# Patient Record
Sex: Female | Born: 1950 | Race: White | Hispanic: No | Marital: Single | Smoking: Never smoker
Health system: Southern US, Community
[De-identification: ages and names within clinical notes are randomized; demographics above are authoritative.]

## PROBLEM LIST (undated history)

## (undated) DIAGNOSIS — T7840XA Allergy, unspecified, initial encounter: Secondary | ICD-10-CM

## (undated) DIAGNOSIS — J45909 Unspecified asthma, uncomplicated: Secondary | ICD-10-CM

## (undated) DIAGNOSIS — D689 Coagulation defect, unspecified: Secondary | ICD-10-CM

## (undated) DIAGNOSIS — F329 Major depressive disorder, single episode, unspecified: Secondary | ICD-10-CM

## (undated) DIAGNOSIS — E119 Type 2 diabetes mellitus without complications: Secondary | ICD-10-CM

## (undated) DIAGNOSIS — R5382 Chronic fatigue, unspecified: Secondary | ICD-10-CM

## (undated) DIAGNOSIS — K219 Gastro-esophageal reflux disease without esophagitis: Secondary | ICD-10-CM

## (undated) DIAGNOSIS — F32A Depression, unspecified: Secondary | ICD-10-CM

## (undated) DIAGNOSIS — M797 Fibromyalgia: Secondary | ICD-10-CM

## (undated) DIAGNOSIS — F419 Anxiety disorder, unspecified: Secondary | ICD-10-CM

## (undated) DIAGNOSIS — I10 Essential (primary) hypertension: Secondary | ICD-10-CM

## (undated) HISTORY — DX: Major depressive disorder, single episode, unspecified: F32.9

## (undated) HISTORY — DX: Anxiety disorder, unspecified: F41.9

## (undated) HISTORY — DX: Type 2 diabetes mellitus without complications: E11.9

## (undated) HISTORY — DX: Fibromyalgia: M79.7

## (undated) HISTORY — DX: Coagulation defect, unspecified: D68.9

## (undated) HISTORY — DX: Essential (primary) hypertension: I10

## (undated) HISTORY — DX: Chronic fatigue, unspecified: R53.82

## (undated) HISTORY — DX: Gastro-esophageal reflux disease without esophagitis: K21.9

## (undated) HISTORY — DX: Allergy, unspecified, initial encounter: T78.40XA

## (undated) HISTORY — PX: APPENDECTOMY: SHX54

## (undated) HISTORY — DX: Unspecified asthma, uncomplicated: J45.909

## (undated) HISTORY — DX: Depression, unspecified: F32.A

## (undated) HISTORY — PX: TONSILLECTOMY: SUR1361

## (undated) HISTORY — PX: TUBAL LIGATION: SHX77

---

## 1978-01-19 HISTORY — PX: AUGMENTATION MAMMAPLASTY: SUR837

## 2017-07-07 ENCOUNTER — Ambulatory Visit (INDEPENDENT_AMBULATORY_CARE_PROVIDER_SITE_OTHER): Payer: Medicare Other | Admitting: Family Medicine

## 2017-07-07 ENCOUNTER — Encounter: Payer: Self-pay | Admitting: Family Medicine

## 2017-07-07 VITALS — BP 130/72 | HR 79 | Temp 97.9°F | Resp 16 | Ht 60.0 in | Wt 201.0 lb

## 2017-07-07 DIAGNOSIS — F329 Major depressive disorder, single episode, unspecified: Secondary | ICD-10-CM | POA: Insufficient documentation

## 2017-07-07 DIAGNOSIS — Z86718 Personal history of other venous thrombosis and embolism: Secondary | ICD-10-CM | POA: Diagnosis not present

## 2017-07-07 DIAGNOSIS — E119 Type 2 diabetes mellitus without complications: Secondary | ICD-10-CM | POA: Diagnosis not present

## 2017-07-07 DIAGNOSIS — K219 Gastro-esophageal reflux disease without esophagitis: Secondary | ICD-10-CM | POA: Insufficient documentation

## 2017-07-07 DIAGNOSIS — J45909 Unspecified asthma, uncomplicated: Secondary | ICD-10-CM | POA: Insufficient documentation

## 2017-07-07 DIAGNOSIS — F32A Depression, unspecified: Secondary | ICD-10-CM | POA: Insufficient documentation

## 2017-07-07 DIAGNOSIS — R5382 Chronic fatigue, unspecified: Secondary | ICD-10-CM | POA: Diagnosis not present

## 2017-07-07 DIAGNOSIS — F339 Major depressive disorder, recurrent, unspecified: Secondary | ICD-10-CM

## 2017-07-07 DIAGNOSIS — F419 Anxiety disorder, unspecified: Secondary | ICD-10-CM | POA: Diagnosis not present

## 2017-07-07 DIAGNOSIS — J302 Other seasonal allergic rhinitis: Secondary | ICD-10-CM

## 2017-07-07 DIAGNOSIS — I10 Essential (primary) hypertension: Secondary | ICD-10-CM

## 2017-07-07 DIAGNOSIS — M797 Fibromyalgia: Secondary | ICD-10-CM | POA: Insufficient documentation

## 2017-07-07 NOTE — Patient Instructions (Signed)

## 2017-07-07 NOTE — Progress Notes (Signed)
Patient: Leslie Gould, Female    DOB: 01-18-1951, 67 y.o.   MRN: [id number] Visit Date: 07/08/2017  Today's Provider: Shirlee LatchAngela Karessa Onorato, MD   I, Joslyn HyEmily Ratchford, CMA, am acting as scribe for Shirlee LatchAngela Raunel Dimartino, MD.  Chief Complaint  Patient presents with  . New Patient (Initial Visit)   Subjective:    Establish Care Leslie Gould is a 67 y.o. female who presents today as a new patient to establish care. She feels fairly well. She reports exercising as she can. Stretches for 20 minutes in the mornings, and walks up stairs, etc. She reports she is sleeping well.  Pt recently moved to [place] form [place], to be closer to her children. Her previous PCP was Dr. Kennis CarinaMichael Wheatley. Her PMH includes DM, HTN, asthma, "severe" anxiety, a clotting disorder, fibromyalgia, chronic constipation.   Her last A1C was between 6.0-7.0%. She states she is working on diet, and her BS are reading 92-127.  Only taking metfomrin 1000mg  BID.  Never taken any other medications. Believes A1c ws checked wihtin the last 3 months. Diagnosed ~5 yrs ago.  HTN: Patient is taking Candesartan 32 mg daily and using a clondine 0.1mg  patch weekly.  She states that she previously tried [facility].  She is not sure if she is tried other antihypertensives.  She states that it took a long time to find this regimen that worked well and controlled her blood pressure well.  She is also taking propranolol, but states this is not for her blood pressure, but instead for tachycardia that is related to her anxiety.  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  She is not sure what year these occurred.  She does not remember undergoing a hematologic work-up before starting Xarelto.  She states that she is now on lifelong Xarelto.  She denies any bleeding.  Depression/Anxiety: Patient states that this is been an issue her entire life.  She got acutely worse in 2005 and had to start taking medication.  She is now on disability for her "severe  "anxiety.  She states she was previously on Risperdal and had tardive dyskinesia and is now taking Cogentin for this.  She states that she stopped Risperdal because it was causing lactation and weight gain.  She is now taking Lexapro 30 mg daily, Vistaril 50 mg nightly, Remeron 45 mg daily, Latuda 160 mg daily.  She is also taking Klonopin 1 mg nightly as needed.  She states that her previous PCP was giving her 20 tablets/month.  She does not use this every night, but she does consistently use all 20 tablets every month.  She states that other medication she is tried in the past include Ativan, Zoloft, Paxil.  When asked if she was seeing a psychiatrist, the patient becomes agitated and insists that she does not need a psychiatrist.  She states that her anxiety is a neurologic issue and not a psychiatric issue.  She states that her previous PCP was managing all of her psychiatric medications.  She states she will never see another psychiatrist or therapist in her life.  She denies SI/HI/AVH.  Fibromyalgia: This is a chronic and long-standing issue.  Patient is taking Flexeril as needed and gabapentin twice daily.  Her gabapentin is ordered for 3 times daily, but she states she only takes this about twice daily.  Asthma,allergic rhinitis: Patient states that she was diagnosed with asthma around age 67.  She states that it started after a flulike illness.  She started  having difficulty breathing and a lot of wheezing.  She is notices also worse when she is around her allergens.  She has not had a flare in about 2 years.  She attributes this to eating local honey daily.  She is using Singulair, Spiriva, Flovent, Breo, and Atrovent.  She also uses Astelin nasal spray and Flonase.  She states that albuterol makes her jittery and so she cannot use this.  She is using Atrovent and Flovent as her rescue inhalers.  She also takes Benadryl at night for her allergies.  She states that Zyrtec makes her too sleepy so she  takes Benadryl instead.  GERD: Patient states that her GERD symptoms are well controlled on daily omeprazole.  She states she tried to come off of this a few years ago and her symptoms recurred.  Patient is also taking Robinul 3 times daily.  She states that this is for spasms of unknown locations and rhinorrhea.  -----------------------------------------------------------------   Review of Systems  Constitutional: Negative.   HENT: Negative.   Eyes: Negative.   Respiratory: Negative.   Cardiovascular: Negative.   Gastrointestinal: Negative.   Endocrine: Negative.   Genitourinary: Negative.   Musculoskeletal: Negative.   Skin: Negative.   Allergic/Immunologic: Negative.   Neurological: Negative.   Hematological: Negative.   Psychiatric/Behavioral: Negative.     Social History      She  reports that she has never smoked. She has never used smokeless tobacco. She reports that she does not drink alcohol or use drugs.       Social History   Socioeconomic History  . Marital status: Single    Spouse name: Not on file  . Number of children: 3  . Years of education: 32  . Highest education level: Associate degree: academic program  Occupational History  . Occupation: disability  Social Needs  . Financial resource strain: Not on file  . Food insecurity:    Worry: Not on file    Inability: Not on file  . Transportation needs:    Medical: Not on file    Non-medical: Not on file  Tobacco Use  . Smoking status: Never Smoker  . Smokeless tobacco: Never Used  Substance and Sexual Activity  . Alcohol use: Never    Frequency: Never  . Drug use: Never  . Sexual activity: Yes    Partners: Male    Birth control/protection: Post-menopausal  Lifestyle  . Physical activity:    Days per week: Not on file    Minutes per session: Not on file  . Stress: Not on file  Relationships  . Social connections:    Talks on phone: Not on file    Gets together: Not on file    Attends  religious service: Not on file    Active member of club or organization: Not on file    Attends meetings of clubs or organizations: Not on file    Relationship status: Not on file  Other Topics Concern  . Not on file  Social History Narrative  . Not on file    Past Medical History:  Diagnosis Date  . Allergy   . Anxiety   . Asthma   . Chronic fatigue   . Clotting disorder (HCC)   . Depression   . Diabetes mellitus without complication (HCC)   . Fibromyalgia   . GERD (gastroesophageal reflux disease)   . Hypertension      Patient Active Problem List   Diagnosis Date Noted  . History  of recurrent deep vein thrombosis (DVT) 07/08/2017  . Allergic rhinitis 07/08/2017  . Hypertension   . GERD (gastroesophageal reflux disease)   . Fibromyalgia   . Diabetes mellitus without complication (HCC)   . Depression   . Chronic fatigue   . Asthma   . Anxiety     Past Surgical History:  Procedure Laterality Date  . APPENDECTOMY    . CESAREAN SECTION    . TONSILLECTOMY    . TUBAL LIGATION      Family History        Family Status  Relation Name Status  . Mother  (Not Specified)  . Father  (Not Specified)  . Daughter  (Not Specified)  . PGM  (Not Specified)  . Neg Hx  (Not Specified)        Her family history includes Anxiety disorder in her daughter and mother; Breast cancer (age of onset: 35) in her paternal grandmother; Heart disease (age of onset: 58) in her father. There is no history of Colon cancer or Ovarian cancer.      Allergies  Allergen Reactions  . Lithium Other (See Comments)    Tremors, nausea     Current Outpatient Medications:  .  ATROVENT HFA 17 MCG/ACT inhaler, , Disp: , Rfl:  .  azelastine (ASTELIN) 0.1 % nasal spray, , Disp: , Rfl:  .  benztropine (COGENTIN) 1 MG tablet, Take 1 mg by mouth 2 (two) times daily., Disp: , Rfl:  .  candesartan (ATACAND) 32 MG tablet, TK 1 T PO QD, Disp: , Rfl: 3 .  clonazePAM (KLONOPIN) 1 MG tablet, Take 1 mg by  mouth at bedtime as needed for anxiety., Disp: , Rfl:  .  cloNIDine (CATAPRES - DOSED IN MG/24 HR) 0.1 mg/24hr patch, Place 0.1 mg onto the skin once a week., Disp: , Rfl:  .  cyclobenzaprine (FLEXERIL) 10 MG tablet, Take 10 mg by mouth at bedtime as needed for muscle spasms., Disp: , Rfl:  .  escitalopram (LEXAPRO) 20 MG tablet, Take 1.5 tablets by mouth at bedtime., Disp: , Rfl:  .  fluticasone (FLONASE) 50 MCG/ACT nasal spray, Place into both nostrils daily., Disp: , Rfl:  .  fluticasone (FLOVENT HFA) 110 MCG/ACT inhaler, Inhale into the lungs 2 (two) times daily., Disp: , Rfl:  .  fluticasone furoate-vilanterol (BREO ELLIPTA) 200-25 MCG/INH AEPB, Inhale 1 puff into the lungs daily., Disp: , Rfl:  .  gabapentin (NEURONTIN) 600 MG tablet, Take 600 mg by mouth 3 (three) times daily., Disp: , Rfl:  .  glycopyrrolate (ROBINUL) 1 MG tablet, Take 1 mg by mouth 3 (three) times daily., Disp: , Rfl:  .  hydrOXYzine (VISTARIL) 50 MG capsule, , Disp: , Rfl:  .  ibuprofen (ADVIL,MOTRIN) 200 MG tablet, Take 400 mg by mouth every 8 (eight) hours as needed., Disp: , Rfl:  .  lurasidone (LATUDA) 40 MG TABS tablet, Take 160 mg by mouth daily with breakfast., Disp: , Rfl:  .  metFORMIN (GLUCOPHAGE) 1000 MG tablet, Take 1,000 mg by mouth 2 (two) times daily with a meal., Disp: , Rfl:  .  mirtazapine (REMERON) 45 MG tablet, Take 45 mg by mouth at bedtime., Disp: , Rfl:  .  montelukast (SINGULAIR) 10 MG tablet, Take 10 mg by mouth at bedtime., Disp: , Rfl:  .  omeprazole (PRILOSEC) 10 MG capsule, , Disp: , Rfl:  .  propranolol (INDERAL) 80 MG tablet, Take 80 mg by mouth 2 (two) times daily., Disp: , Rfl:  .  rivaroxaban (XARELTO) 20 MG TABS tablet, Take 20 mg by mouth daily with supper., Disp: , Rfl:  .  tiotropium (SPIRIVA) 18 MCG inhalation capsule, Place 18 mcg into inhaler and inhale daily., Disp: , Rfl:    Patient Care Team: Erasmo Downer, MD as PCP - General (Family Medicine)      Objective:    Vitals: BP 130/72 (BP Location: Left Arm, Patient Position: Sitting, Cuff Size: Normal)   Pulse 79   Temp 97.9 F (36.6 C) (Oral)   Resp 16   Ht 5' (1.524 m)   Wt 201 lb (91.2 kg)   SpO2 96%   BMI 39.26 kg/m    Vitals:   07/07/17 1412  BP: 130/72  Pulse: 79  Resp: 16  Temp: 97.9 F (36.6 C)  TempSrc: Oral  SpO2: 96%  Weight: 201 lb (91.2 kg)  Height: 5' (1.524 m)     Physical Exam  Constitutional: She is oriented to person, place, and time. She appears well-developed and well-nourished. No distress.  HENT:  Head: Normocephalic and atraumatic.  Right Ear: External ear normal.  Left Ear: External ear normal.  Nose: Nose normal.  Mouth/Throat: Oropharynx is clear and moist. No oropharyngeal exudate.  Eyes: Pupils are equal, round, and reactive to light. Conjunctivae and EOM are normal. Right eye exhibits no discharge. Left eye exhibits no discharge. No scleral icterus.  Neck: Neck supple. No thyromegaly present.  Cardiovascular: Normal rate, regular rhythm, normal heart sounds and intact distal pulses.  No murmur heard. Pulmonary/Chest: Effort normal and breath sounds normal. No respiratory distress. She has no wheezes. She has no rales.  Abdominal: Soft. Bowel sounds are normal. She exhibits no distension. There is no tenderness. There is no rebound and no guarding.  Musculoskeletal: She exhibits no edema or deformity.  Lymphadenopathy:    She has no cervical adenopathy.  Neurological: She is alert and oriented to person, place, and time.  Skin: Skin is warm and dry. Capillary refill takes less than 2 seconds. No rash noted.  Psychiatric: Her speech is normal. Her mood appears anxious. Her affect is labile. She is agitated. She is not slowed, not withdrawn and not actively hallucinating. Thought content is not paranoid and not delusional. Cognition and memory are normal. She expresses no homicidal and no suicidal ideation. She expresses no suicidal plans and no homicidal  plans. She is attentive.  Vitals reviewed.    Depression Screen PHQ 2/9 Scores 07/07/2017  PHQ - 2 Score 0     Assessment & Plan:    Problem List Items Addressed This Visit      Cardiovascular and Mediastinum   Hypertension - Primary    Well-controlled currently Continue current medications Patient declines any lab work today as she insists that previous PCP has drawn this within the last 3 months      Relevant Medications   candesartan (ATACAND) 32 MG tablet   propranolol (INDERAL) 80 MG tablet   rivaroxaban (XARELTO) 20 MG TABS tablet   cloNIDine (CATAPRES - DOSED IN MG/24 HR) 0.1 mg/24hr patch     Respiratory   Asthma    Currently well controlled Patient reports no flares within the last 2 years Discussed with the patient that it is odd that she is taking both Flovent and Breo Advised that she should stop the Flovent, but she gets very angry and states that she needs this and it is her rescue inhaler Advised patient that I will not prescribe both Flovent and Breo She  can continue to use Atrovent as a rescue inhaler Continue other medications      Relevant Medications   ATROVENT HFA 17 MCG/ACT inhaler   montelukast (SINGULAIR) 10 MG tablet   fluticasone furoate-vilanterol (BREO ELLIPTA) 200-25 MCG/INH AEPB   fluticasone (FLOVENT HFA) 110 MCG/ACT inhaler   tiotropium (SPIRIVA) 18 MCG inhalation capsule   Allergic rhinitis    Seems to be related to the patient's asthma and seems to be a trigger for her asthma She can continue use of local honey as this seems to be helping Continue nasal sprays as needed Discussed with patient that given her age, it is safer for her to take a second generation antihistamine such as Zyrtec or Claritin instead of Benadryl given the risk of anticholinergics in the elderly and that this medication is on the beers list Patient insists that she will not stop taking Benadryl and she will not take a second generation antihistamine         Digestive   GERD (gastroesophageal reflux disease)    Well-controlled Continue PPI If has difficulty in the future, despite PPI therapy, could consider GI referral for possible EGD      Relevant Medications   omeprazole (PRILOSEC) 10 MG capsule   glycopyrrolate (ROBINUL) 1 MG tablet     Endocrine   Diabetes mellitus without complication (HCC)    Reportedly well controlled Patient declines A1c testing today as she reports this was all done within the last 3 months by previous PCP We will continue her metformin at current dose Also plan to update vaccines, eye exam, foot exam, etc. pending PCP records showing what is still due and what has already been done      Relevant Medications   candesartan (ATACAND) 32 MG tablet   metFORMIN (GLUCOPHAGE) 1000 MG tablet     Other   Fibromyalgia    Stable, chronic issue Continue Flexeril and gabapentin In the future, for better control, could consider more consistent gabapentin 3 times daily dosing      Depression    Controlled, but anxiety seems to be the patient's bigger issue See plan for anxiety regarding my encouragement for the patient to see a psychiatrist      Relevant Medications   hydrOXYzine (VISTARIL) 50 MG capsule   escitalopram (LEXAPRO) 20 MG tablet   mirtazapine (REMERON) 45 MG tablet   Chronic fatigue    Chronic and stable issue Likely related to fibromyalgia and uncontrolled anxiety See plans for fibromyalgia and anxiety Patient declines any lab testing today as she insists that her previous PCP did all the labs that were necessary within the last 3 months      Anxiety    Uncontrolled, the patient states that current medication regimen was better than anything she has been on in the past Discussed with patient that since this is her biggest health concern at this time and it is reason she is on disability and taking multiple medications, she should be seeing a specialist for this Also discussed that due to her  combination and multiple medications, a psychiatrist would be best suited to adjust these as needed in the future Patient is adamant that she will not see a psychiatrist Discussed with the patient that I will not prescribe her medications for her anxiety given that she does need specialist care for this issue She is very agitated at this, but continues to insist that she will not see a psychiatrist At follow-up visits, we can continue to discuss possible  referral If she does accept referral, I am happy to refill her medications until she can get an appointment, but otherwise I will not be able to continue managing this medical problem      Relevant Medications   hydrOXYzine (VISTARIL) 50 MG capsule   escitalopram (LEXAPRO) 20 MG tablet   mirtazapine (REMERON) 45 MG tablet   History of recurrent deep vein thrombosis (DVT)    Patient has history of DVT x2 in the left leg per report It is unclear what the circumstances were and whether this was provoked or not Would like to review records from previous PCP to determine if she had any work-up for possible coagulopathy We will continue Xarelto currently, but wonder if she may benefit from coagulopathy work-up in the future and whether she would actually need this lifelong given newer recommendations Could consider hematology could consider hematology referral in the future          Return in about 3 months (around 10/07/2017) for DM, HTN f/u.  We will request records from previous PCP regarding office visit notes, medications, lab work, medical problems, and health maintenance   The entirety of the information documented in the History of Present Illness, Review of Systems and Physical Exam were personally obtained by me. Portions of this information were initially documented by Irving Burton Ratchford, CMA and reviewed by me for thoroughness and accuracy.    Erasmo Downer, MD, MPH [facility] 07/08/2017 2:43 PM

## 2017-07-08 ENCOUNTER — Encounter: Payer: Self-pay | Admitting: Family Medicine

## 2017-07-08 DIAGNOSIS — J309 Allergic rhinitis, unspecified: Secondary | ICD-10-CM | POA: Insufficient documentation

## 2017-07-08 DIAGNOSIS — Z86718 Personal history of other venous thrombosis and embolism: Secondary | ICD-10-CM | POA: Insufficient documentation

## 2017-07-08 NOTE — Assessment & Plan Note (Signed)
Seems to be related to the patient's asthma and seems to be a trigger for her asthma She can continue use of local honey as this seems to be helping Continue nasal sprays as needed Discussed with patient that given her age, it is safer for her to take a second generation antihistamine such as Zyrtec or Claritin instead of Benadryl given the risk of anticholinergics in the elderly and that this medication is on the beers list Patient insists that she will not stop taking Benadryl and she will not take a second generation antihistamine

## 2017-07-08 NOTE — Assessment & Plan Note (Addendum)
Patient has history of DVT x2 in the left leg per report It is unclear what the circumstances were and whether this was provoked or not Would like to review records from previous PCP to determine if she had any work-up for possible coagulopathy We will continue Xarelto currently, but wonder if she may benefit from coagulopathy work-up in the future and whether she would actually need this lifelong given newer recommendations Could consider hematology could consider hematology referral in the future

## 2017-07-08 NOTE — Assessment & Plan Note (Signed)
Reportedly well controlled Patient declines A1c testing today as she reports this was all done within the last 3 months by previous PCP We will continue her metformin at current dose Also plan to update vaccines, eye exam, foot exam, etc. pending PCP records showing what is still due and what has already been done

## 2017-07-08 NOTE — Assessment & Plan Note (Signed)
Currently well controlled Patient reports no flares within the last 2 years Discussed with the patient that it is odd that she is taking both Flovent and Virgel BouquetBreo Advised that she should stop the Flovent, but she gets very angry and states that she needs this and it is her rescue inhaler Advised patient that I will not prescribe both Flovent and Breo She can continue to use Atrovent as a rescue inhaler Continue other medications

## 2017-07-08 NOTE — Assessment & Plan Note (Addendum)
Controlled, but anxiety seems to be the patient's bigger issue See plan for anxiety regarding my encouragement for the patient to see a psychiatrist

## 2017-07-08 NOTE — Assessment & Plan Note (Signed)
Well-controlled currently Continue current medications Patient declines any lab work today as she insists that previous PCP has drawn this within the last 3 months

## 2017-07-08 NOTE — Assessment & Plan Note (Signed)
Uncontrolled, the patient states that current medication regimen was better than anything she has been on in the past Discussed with patient that since this is her biggest health concern at this time and it is reason she is on disability and taking multiple medications, she should be seeing a specialist for this Also discussed that due to her combination and multiple medications, a psychiatrist would be best suited to adjust these as needed in the future Patient is adamant that she will not see a psychiatrist Discussed with the patient that I will not prescribe her medications for her anxiety given that she does need specialist care for this issue She is very agitated at this, but continues to insist that she will not see a psychiatrist At follow-up visits, we can continue to discuss possible referral If she does accept referral, I am happy to refill her medications until she can get an appointment, but otherwise I will not be able to continue managing this medical problem

## 2017-07-08 NOTE — Assessment & Plan Note (Signed)
Chronic and stable issue Likely related to fibromyalgia and uncontrolled anxiety See plans for fibromyalgia and anxiety Patient declines any lab testing today as she insists that her previous PCP did all the labs that were necessary within the last 3 months

## 2017-07-08 NOTE — Assessment & Plan Note (Signed)
Well-controlled Continue PPI If has difficulty in the future, despite PPI therapy, could consider GI referral for possible EGD

## 2017-07-08 NOTE — Assessment & Plan Note (Signed)
Stable, chronic issue Continue Flexeril and gabapentin In the future, for better control, could consider more consistent gabapentin 3 times daily dosing

## 2017-09-11 ENCOUNTER — Emergency Department
Admission: EM | Admit: 2017-09-11 | Discharge: 2017-09-11 | Disposition: A | Discharge status: Home / Self Care | Payer: Medicare Other | Attending: Emergency Medicine | Admitting: Emergency Medicine

## 2017-09-11 ENCOUNTER — Emergency Department: Payer: Medicare Other

## 2017-09-11 ENCOUNTER — Encounter: Payer: Self-pay | Admitting: Emergency Medicine

## 2017-09-11 ENCOUNTER — Other Ambulatory Visit: Payer: Self-pay

## 2017-09-11 DIAGNOSIS — J45909 Unspecified asthma, uncomplicated: Secondary | ICD-10-CM | POA: Insufficient documentation

## 2017-09-11 DIAGNOSIS — Y9389 Activity, other specified: Secondary | ICD-10-CM | POA: Diagnosis not present

## 2017-09-11 DIAGNOSIS — S20212A Contusion of left front wall of thorax, initial encounter: Secondary | ICD-10-CM | POA: Insufficient documentation

## 2017-09-11 DIAGNOSIS — Y92009 Unspecified place in unspecified non-institutional (private) residence as the place of occurrence of the external cause: Secondary | ICD-10-CM

## 2017-09-11 DIAGNOSIS — S4992XA Unspecified injury of left shoulder and upper arm, initial encounter: Secondary | ICD-10-CM | POA: Diagnosis present

## 2017-09-11 DIAGNOSIS — F419 Anxiety disorder, unspecified: Secondary | ICD-10-CM | POA: Insufficient documentation

## 2017-09-11 DIAGNOSIS — S42032A Displaced fracture of lateral end of left clavicle, initial encounter for closed fracture: Secondary | ICD-10-CM

## 2017-09-11 DIAGNOSIS — E119 Type 2 diabetes mellitus without complications: Secondary | ICD-10-CM | POA: Insufficient documentation

## 2017-09-11 DIAGNOSIS — W19XXXA Unspecified fall, initial encounter: Secondary | ICD-10-CM

## 2017-09-11 DIAGNOSIS — Z7901 Long term (current) use of anticoagulants: Secondary | ICD-10-CM | POA: Insufficient documentation

## 2017-09-11 DIAGNOSIS — Z79899 Other long term (current) drug therapy: Secondary | ICD-10-CM | POA: Diagnosis not present

## 2017-09-11 DIAGNOSIS — Y929 Unspecified place or not applicable: Secondary | ICD-10-CM | POA: Diagnosis not present

## 2017-09-11 DIAGNOSIS — W010XXA Fall on same level from slipping, tripping and stumbling without subsequent striking against object, initial encounter: Secondary | ICD-10-CM | POA: Insufficient documentation

## 2017-09-11 DIAGNOSIS — Y998 Other external cause status: Secondary | ICD-10-CM | POA: Diagnosis not present

## 2017-09-11 DIAGNOSIS — F329 Major depressive disorder, single episode, unspecified: Secondary | ICD-10-CM | POA: Diagnosis not present

## 2017-09-11 DIAGNOSIS — Z7984 Long term (current) use of oral hypoglycemic drugs: Secondary | ICD-10-CM | POA: Diagnosis not present

## 2017-09-11 MED ORDER — OXYCODONE-ACETAMINOPHEN 5-325 MG PO TABS
1.0000 | ORAL_TABLET | Freq: Once | ORAL | Status: AC
  Administered 2017-09-11: 1 via ORAL
  Filled 2017-09-11: qty 1

## 2017-09-11 MED ORDER — OXYCODONE-ACETAMINOPHEN 7.5-325 MG PO TABS: 1.0000 | ORAL_TABLET | Freq: Four times a day (QID) | ORAL | 0 refills | Status: DC | PRN

## 2017-09-11 NOTE — ED Notes (Signed)

## 2017-09-11 NOTE — ED Triage Notes (Signed)
Pt to ED via POV c/o fall. Pt states that she has fallen twice in the last week. Pt states that she thinks she is falling due to her shoes. Pt states that she landed on left side and shoudler both times. Pt is in NAD at this time.

## 2017-09-11 NOTE — ED Provider Notes (Signed)
[facility] Emergency Department Provider Note   ____________________________________________   First MD Initiated Contact with Patient 09/11/17 1038     (approximate)  I have reviewed the triage vital signs and the nursing notes.   HISTORY  Chief Complaint Fall    HPI Leslie Gould is a 67 y.o. female patient complain of left shoulder and left rib pain secondary to to fall incidents.  First fall occurred 2 days ago and his last fall.  Yesterday.  Patient still wearing a rubber sandals that cause her fall.  Patient said he has decreased range of motion with the left shoulder with extension and overhead reaching.  Patient states mild discomfort with deep inspirations on the left lateral ribs.  Patient rates the pain as 8/10.  Patient described the pain is "aching".  Patient is right-hand dominant.  Patient used over-the-counter anti-inflammatory medication with mild trans-relief.   Past Medical History:  Diagnosis Date  . Allergy   . Anxiety   . Asthma   . Chronic fatigue   . Clotting disorder (HCC)   . Depression   . Diabetes mellitus without complication (HCC)   . Fibromyalgia   . GERD (gastroesophageal reflux disease)   . Hypertension     Patient Active Problem List   Diagnosis Date Noted  . History of recurrent deep vein thrombosis (DVT) 07/08/2017  . Allergic rhinitis 07/08/2017  . Hypertension   . GERD (gastroesophageal reflux disease)   . Fibromyalgia   . Diabetes mellitus without complication (HCC)   . Depression   . Chronic fatigue   . Asthma   . Anxiety     Past Surgical History:  Procedure Laterality Date  . APPENDECTOMY    . CESAREAN SECTION    . TONSILLECTOMY    . TUBAL LIGATION      Prior to Admission medications   Medication Sig Start Date End Date Taking? Authorizing Provider  ATROVENT HFA 17 MCG/ACT inhaler  07/01/17   [provider]  azelastine (ASTELIN) 0.1 % nasal spray  07/01/17   [provider]  benztropine (COGENTIN) 1 MG tablet Take 1 mg by mouth 2 (two) times daily.    [provider]  candesartan (ATACAND) 32 MG tablet TK 1 T PO QD 06/21/17   [provider]  clonazePAM (KLONOPIN) 1 MG tablet Take 1 mg by mouth at bedtime as needed for anxiety.    [provider]  cloNIDine (CATAPRES - DOSED IN MG/24 HR) 0.1 mg/24hr patch Place 0.1 mg onto the skin once a week.    [provider]  cyclobenzaprine (FLEXERIL) 10 MG tablet Take 10 mg by mouth at bedtime as needed for muscle spasms.    [provider]  escitalopram (LEXAPRO) 20 MG tablet Take 1.5 tablets by mouth at bedtime. 06/26/17   [provider]  fluticasone (FLONASE) 50 MCG/ACT nasal spray Place into both nostrils daily.    [provider]  fluticasone (FLOVENT HFA) 110 MCG/ACT inhaler Inhale into the lungs 2 (two) times daily.    [provider]  fluticasone furoate-vilanterol (BREO ELLIPTA) 200-25 MCG/INH AEPB Inhale 1 puff into the lungs daily.    [provider]  gabapentin (NEURONTIN) 600 MG tablet Take 600 mg by mouth 3 (three) times daily.    [provider]  glycopyrrolate (ROBINUL) 1 MG tablet Take 1 mg by mouth 3 (three) times daily.    [provider]  hydrOXYzine (VISTARIL) 50 MG capsule  07/05/17   [provider]  ibuprofen (ADVIL,MOTRIN) 200 MG tablet Take 400 mg by mouth every 8 (eight) hours as needed.    [provider]  lurasidone (LATUDA) 40 MG TABS tablet Take 160 mg by mouth daily with breakfast.    [provider]  metFORMIN (GLUCOPHAGE) 1000 MG tablet Take 1,000 mg by mouth 2 (two) times daily with a meal.    [provider]  mirtazapine (REMERON) 45 MG tablet Take 45 mg by mouth at bedtime.    [provider]  montelukast (SINGULAIR) 10 MG tablet Take 10 mg by mouth at bedtime.    [provider]  omeprazole (PRILOSEC) 10 MG capsule  06/29/17   [provider]  oxyCODONE-acetaminophen (PERCOCET) 7.5-325 MG tablet Take 1 tablet by mouth every 6 (six) hours as needed. 09/11/17   Joni Reining, PA-C  propranolol (INDERAL) 80 MG tablet Take 80 mg by mouth 2 (two) times daily.    [provider]  rivaroxaban (XARELTO) 20 MG TABS tablet Take 20 mg by mouth daily with supper.    [provider]  tiotropium (SPIRIVA) 18 MCG inhalation capsule Place 18 mcg into inhaler and inhale daily.    [provider]    Allergies Lithium  Family History  Problem Relation Age of Onset  . Anxiety disorder Mother   . Heart disease Father 82  . Anxiety disorder Daughter   . Breast cancer Paternal Grandmother 63  . Colon cancer Neg Hx   . Ovarian cancer Neg Hx     Social History Social History   Tobacco Use  . Smoking status: Never Smoker  . Smokeless tobacco: Never Used  Substance Use Topics  . Alcohol use: Never    Frequency: Never  . Drug use: Never    Review of Systems  Constitutional: No fever/chills Eyes: No visual changes. ENT: No sore throat. Cardiovascular: Denies chest pain. Respiratory: Denies shortness of breath. Gastrointestinal: No abdominal pain.  No nausea, no vomiting.  No diarrhea.  No constipation. Genitourinary: Negative for dysuria. Musculoskeletal: Negative for back pain. Skin: Negative for rash. Neurological: Negative for headaches, focal weakness or numbness. Psychiatric:Anxiety and depression. Endocrine:Diabetes and hypertension. Allergic/Immunilogical: Lithium ____________________________________________   PHYSICAL EXAM:  VITAL SIGNS: ED Triage Vitals  Enc Vitals Group     BP 09/11/17 1026 120/77     Pulse Rate 09/11/17 1026 69     Resp 09/11/17 1026 15     Temp 09/11/17 1026 98.4 F (36.9 C)     Temp Source 09/11/17 1026 Oral     SpO2 09/11/17 1026 97 %     Weight 09/11/17 1027 195 lb (88.5 kg)     Height 09/11/17 1027 5' (1.524 m)     Head Circumference --       Peak Flow --      Pain Score 09/11/17 1033 8     Pain Loc --      Pain Edu? --      Excl. in GC? --    Constitutional: Alert and oriented. Well appearing and in no acute distress. Neck:No cervical spine tenderness to palpation. Cardiovascular: Normal rate, regular rhythm. Grossly normal heart sounds.  Good peripheral circulation. Respiratory: Normal respiratory effort.  No retractions. Lungs CTAB. Gastrointestinal: Soft and nontender. No distention. No abdominal bruits. No CVA tenderness. Musculoskeletal: No obvious left shoulder chest wall deformity.  Decreased range of motion left upper extremity limited by complaint of pain. Neurologic:  Normal speech and language. No gross focal neurologic  deficits are appreciated. No gait instability. Skin:  Skin is warm, dry and intact. No rash noted.  Ecchymosis inferior left clavicle area. Psychiatric: Mood and affect are normal. Speech and behavior are normal.  ____________________________________________   LABS (all labs ordered are listed, but only abnormal results are displayed)  Labs Reviewed - No data to display ____________________________________________  EKG   ____________________________________________  RADIOLOGY  ED MD interpretation:    Official radiology report(s): Dg Ribs Unilateral W/chest Left  Result Date: 09/11/2017 CLINICAL DATA:  Left rib pain after fall. EXAM: LEFT RIBS AND CHEST - 3+ VIEW COMPARISON:  None. FINDINGS: No fracture or other bone lesions are seen involving the ribs. There is no evidence of pneumothorax or pleural effusion. Both lungs are clear. Heart size and mediastinal contours are within normal limits. IMPRESSION: Normal left ribs.  No acute cardiopulmonary abnormality seen. Electronically Signed   By: Lupita RaiderJames  Green Jr, M.D.   On: 09/11/2017 11:24   Dg Shoulder Left  Result Date: 09/11/2017 CLINICAL DATA:  Left shoulder pain after fall. EXAM: LEFT SHOULDER - 2+ VIEW COMPARISON:  None. FINDINGS:  Moderately displaced distal left clavicular fracture is noted. Glenohumeral joint appears normal. Visualized ribs appear normal. IMPRESSION: Moderately displaced distal left clavicular fracture. Electronically Signed   By: Lupita RaiderJames  Green Jr, M.D.   On: 09/11/2017 11:21    ____________________________________________   PROCEDURES  Procedure(s) performed: None  Procedures  Critical Care performed: No  ____________________________________________   INITIAL IMPRESSION / ASSESSMENT AND PLAN / ED COURSE  As part of my medical decision making, I reviewed the following data within the electronic MEDICAL RECORD NUMBER    Pain secondary from repetitive fall resulting in the left distal clavicle fracture and rib contusion.  Discussed x-ray findings with patient.  Patient placed in arm sling and advised to follow-up with orthopedics by calling for an appointment Monday morning.  Take medication as directed but be advised this medicine can cause drowsiness.     ____________________________________________   FINAL CLINICAL IMPRESSION(S) / ED DIAGNOSES  Final diagnoses:  Displaced fracture of lateral end of left clavicle, initial encounter for closed fracture  Rib contusion, left, initial encounter  Fall in home, initial encounter     ED Discharge Orders         Ordered    oxyCODONE-acetaminophen (PERCOCET) 7.5-325 MG tablet  Every 6 hours PRN     09/11/17 1132           Note:  This document was prepared using Dragon voice recognition software and may include unintentional dictation errors.    Joni ReiningSmith, Ronald K, PA-C 09/11/17 1135    Don PerkingVeronese, [place], MD 09/11/17 [phone]

## 2017-09-11 NOTE — Discharge Instructions (Signed)
Wear arm sling until evaluation by orthopedics. °

## 2017-09-11 NOTE — ED Notes (Signed)
See triage note  States she fell   Landed on left shoulder and also hit left ribs  Pain is mainly lateral ribs

## 2017-09-17 ENCOUNTER — Emergency Department
Admission: EM | Admit: 2017-09-17 | Discharge: 2017-09-17 | Disposition: A | Discharge status: Home / Self Care | Payer: Medicare Other | Attending: Emergency Medicine | Admitting: Emergency Medicine

## 2017-09-17 ENCOUNTER — Encounter: Payer: Self-pay | Admitting: Emergency Medicine

## 2017-09-17 DIAGNOSIS — Z7901 Long term (current) use of anticoagulants: Secondary | ICD-10-CM | POA: Insufficient documentation

## 2017-09-17 DIAGNOSIS — K0889 Other specified disorders of teeth and supporting structures: Secondary | ICD-10-CM

## 2017-09-17 DIAGNOSIS — X58XXXD Exposure to other specified factors, subsequent encounter: Secondary | ICD-10-CM | POA: Insufficient documentation

## 2017-09-17 DIAGNOSIS — Z7984 Long term (current) use of oral hypoglycemic drugs: Secondary | ICD-10-CM | POA: Insufficient documentation

## 2017-09-17 DIAGNOSIS — S42032D Displaced fracture of lateral end of left clavicle, subsequent encounter for fracture with routine healing: Secondary | ICD-10-CM | POA: Diagnosis not present

## 2017-09-17 DIAGNOSIS — Z79899 Other long term (current) drug therapy: Secondary | ICD-10-CM | POA: Diagnosis not present

## 2017-09-17 DIAGNOSIS — I10 Essential (primary) hypertension: Secondary | ICD-10-CM | POA: Diagnosis not present

## 2017-09-17 DIAGNOSIS — J45909 Unspecified asthma, uncomplicated: Secondary | ICD-10-CM | POA: Insufficient documentation

## 2017-09-17 DIAGNOSIS — E119 Type 2 diabetes mellitus without complications: Secondary | ICD-10-CM | POA: Diagnosis not present

## 2017-09-17 DIAGNOSIS — K029 Dental caries, unspecified: Secondary | ICD-10-CM | POA: Insufficient documentation

## 2017-09-17 MED ORDER — OXYCODONE-ACETAMINOPHEN 5-325 MG PO TABS: 1.0000 | ORAL_TABLET | Freq: Four times a day (QID) | ORAL | 0 refills | Status: DC | PRN

## 2017-09-17 MED ORDER — AMOXICILLIN 875 MG PO TABS: 875.0000 mg | ORAL_TABLET | Freq: Two times a day (BID) | ORAL | 0 refills | Status: DC

## 2017-09-17 NOTE — Discharge Instructions (Signed)
Call today to make an appointment with Dr. Odis LusterBowers who is the orthopedist on-call.  Their office will not call you.  You will need to make an appointment for follow-up and continued care of your fracture.  Continue wearing sling as needed for support and also continue applying ice as needed for pain or swelling.  Percocet 1 every 6 hours as needed for pain.  Any continued pain medication will need to come from [facility].  Also begin taking amoxicillin 875 twice daily for the next 10 days.  When you have been medically cleared by your doctor you should have your dental work done.

## 2017-09-17 NOTE — ED Triage Notes (Signed)
Pt reports that she fractured her left clavicle last Wednesday was seen for it Saturday and then she developed a tooth abscess. Pt reports she was put off of dental care for a bit due to her injury but she states that she needs something now. Pt reports she needs an antibiotic and something for pain. Pt reports her left lower tooth and her clavicle are hurting. Pt states that she is out of percocet because she only got 20.

## 2017-09-17 NOTE — ED Provider Notes (Signed)
[facility] Emergency Department Provider Note  ____________________________________________   First MD Initiated Contact with Patient 09/17/17 [phone]     (approximate)  I have reviewed the triage vital signs and the nursing notes.   HISTORY  Chief Complaint Clavicle Injury and Dental Pain   HPI Leslie Gould is a 67 y.o. female presents to the emergency department with complaint of continued left clavicle pain after she was diagnosed with a fracture in the emergency department 6 days ago.  Patient states that she is out of Percocet and pain is about the same.  She denies any recent or reinjury.  She continues to wear her sling that was provided for her on the day of her diagnosis.  Patient also states that she has a dental abscess and is need of antibiotics.  She was seen by a dentist yesterday who is now waiting for her to be medically cleared so that they may pull her tooth.  She is a patient at [facility].  They have had her discontinue her blood thinners so that she may have a dental procedure.  She denies any fever or chills.  She also has not called the orthopedist that she was referred to and states that she is waiting for them to call her with an appointment.  She rates her pain as an 8 out of 10.   Past Medical History:  Diagnosis Date  . Allergy   . Anxiety   . Asthma   . Chronic fatigue   . Clotting disorder (HCC)   . Depression   . Diabetes mellitus without complication (HCC)   . Fibromyalgia   . GERD (gastroesophageal reflux disease)   . Hypertension     Patient Active Problem List   Diagnosis Date Noted  . History of recurrent deep vein thrombosis (DVT) 07/08/2017  . Allergic rhinitis 07/08/2017  . Hypertension   . GERD (gastroesophageal reflux disease)   . Fibromyalgia   . Diabetes mellitus without complication (HCC)   . Depression   . Chronic fatigue   . Asthma   . Anxiety     Past Surgical History:  Procedure Laterality  Date  . APPENDECTOMY    . CESAREAN SECTION    . TONSILLECTOMY    . TUBAL LIGATION      Prior to Admission medications   Medication Sig Start Date End Date Taking? Authorizing Provider  amoxicillin (AMOXIL) 875 MG tablet Take 1 tablet (875 mg total) by mouth 2 (two) times daily. 09/17/17   Tommi Rumps, PA-C  ATROVENT HFA 17 MCG/ACT inhaler  07/01/17   [provider]  azelastine (ASTELIN) 0.1 % nasal spray  07/01/17   [provider]  benztropine (COGENTIN) 1 MG tablet Take 1 mg by mouth 2 (two) times daily.    [provider]  candesartan (ATACAND) 32 MG tablet TK 1 T PO QD 06/21/17   [provider]  clonazePAM (KLONOPIN) 1 MG tablet Take 1 mg by mouth at bedtime as needed for anxiety.    [provider]  cloNIDine (CATAPRES - DOSED IN MG/24 HR) 0.1 mg/24hr patch Place 0.1 mg onto the skin once a week.    [provider]  cyclobenzaprine (FLEXERIL) 10 MG tablet Take 10 mg by mouth at bedtime as needed for muscle spasms.    [provider]  escitalopram (LEXAPRO) 20 MG tablet Take 1.5 tablets by mouth at bedtime. 06/26/17   [provider]  fluticasone (FLONASE) 50 MCG/ACT nasal spray  Place into both nostrils daily.    [provider]  fluticasone (FLOVENT HFA) 110 MCG/ACT inhaler Inhale into the lungs 2 (two) times daily.    [provider]  fluticasone furoate-vilanterol (BREO ELLIPTA) 200-25 MCG/INH AEPB Inhale 1 puff into the lungs daily.    [provider]  gabapentin (NEURONTIN) 600 MG tablet Take 600 mg by mouth 3 (three) times daily.    [provider]  glycopyrrolate (ROBINUL) 1 MG tablet Take 1 mg by mouth 3 (three) times daily.    [provider]  hydrOXYzine (VISTARIL) 50 MG capsule  07/05/17   [provider]  ibuprofen (ADVIL,MOTRIN) 200 MG tablet Take 400 mg by mouth every 8 (eight) hours as needed.    [provider]  lurasidone (LATUDA) 40 MG  TABS tablet Take 160 mg by mouth daily with breakfast.    [provider]  metFORMIN (GLUCOPHAGE) 1000 MG tablet Take 1,000 mg by mouth 2 (two) times daily with a meal.    [provider]  mirtazapine (REMERON) 45 MG tablet Take 45 mg by mouth at bedtime.    [provider]  montelukast (SINGULAIR) 10 MG tablet Take 10 mg by mouth at bedtime.    [provider]  omeprazole (PRILOSEC) 10 MG capsule  06/29/17   [provider]  oxyCODONE-acetaminophen (PERCOCET) 5-325 MG tablet Take 1 tablet by mouth every 6 (six) hours as needed for severe pain. 09/17/17   Tommi Rumps, PA-C  propranolol (INDERAL) 80 MG tablet Take 80 mg by mouth 2 (two) times daily.    [provider]  rivaroxaban (XARELTO) 20 MG TABS tablet Take 20 mg by mouth daily with supper.    [provider]  tiotropium (SPIRIVA) 18 MCG inhalation capsule Place 18 mcg into inhaler and inhale daily.    [provider]    Allergies Lithium  Family History  Problem Relation Age of Onset  . Anxiety disorder Mother   . Heart disease Father 55  . Anxiety disorder Daughter   . Breast cancer Paternal Grandmother 29  . Colon cancer Neg Hx   . Ovarian cancer Neg Hx     Social History Social History   Tobacco Use  . Smoking status: Never Smoker  . Smokeless tobacco: Never Used  Substance Use Topics  . Alcohol use: Never    Frequency: Never  . Drug use: Never    Review of Systems Constitutional: No fever/chills Eyes: No visual changes. ENT: For dental pain. Cardiovascular: Denies chest pain. Respiratory: Denies shortness of breath. Gastrointestinal: No abdominal pain.  No nausea, no vomiting.  Musculoskeletal: Positive for left clavicle pain. Skin: Negative for rash. Neurological: Negative for  focal weakness or numbness. ____________________________________________   PHYSICAL EXAM:  VITAL SIGNS: ED Triage Vitals  Enc Vitals Group     BP  09/17/17 0948 117/72     Pulse Rate 09/17/17 0948 85     Resp 09/17/17 0948 20     Temp 09/17/17 0948 98.2 F (36.8 C)     Temp Source 09/17/17 0948 Oral     SpO2 09/17/17 0948 96 %     Weight 09/17/17 0947 200 lb (90.7 kg)     Height 09/17/17 0947 5' (1.524 m)     Head Circumference --      Peak Flow --      Pain Score 09/17/17 0947 8     Pain Loc --      Pain Edu? --  Excl. in GC? --    Constitutional: Alert and oriented. Well appearing and in no acute distress. Eyes: Conjunctivae are normal.  Head: Atraumatic. Nose: No congestion/rhinnorhea. Mouth/Throat: Left lower premolar is in poor repair and edema around the tooth.  No drainage noted. Neck: No stridor.   Hematological/Lymphatic/Immunilogical: No cervical lymphadenopathy. Cardiovascular: Normal rate, regular rhythm. Grossly normal heart sounds.  Good peripheral circulation. Respiratory: Normal respiratory effort.  No retractions. Lungs CTAB. Gastrointestinal: Soft and nontender. No distention.  Musculoskeletal: Examination of left shoulder anteriorly there continues to be some resolving ecchymosis to the distal portion of the clavicle.  Soft tissue swelling is minimal.  Range of motion of the shoulder is decreased secondary to patient's pain.  There is some point tenderness on palpation of [facility] area.  No gross deformity is noted.  Patient continues to wear her arm sling. Neurologic:  Normal speech and language. No gross focal neurologic deficits are appreciated.  Skin:  Skin is warm, dry.  Also as noted above all muscle skeletal. Psychiatric: Mood and affect are normal. Speech and behavior are normal.  ____________________________________________   LABS (all labs ordered are listed, but only abnormal results are displayed)  Labs Reviewed - No data to display  RADIOLOGY   PROCEDURES  Procedure(s) performed: None  Procedures  Critical Care performed:  No  ____________________________________________   INITIAL IMPRESSION / ASSESSMENT AND PLAN / ED COURSE  As part of my medical decision making, I reviewed the following data within the electronic MEDICAL RECORD NUMBER Notes from prior ED visits and [place] Controlled Substance Database  Patient was encouraged to make an appointment with the orthopedist.  She has lost her discharge papers from her previous ED visit.  She was given the name of the orthopedist listed today follow-up with her clavicle fracture.  She will continue wearing her sling as needed for comfort.  Patient was given prescription for Percocet No. 12.  Patient was also given a prescription for amoxicillin 875 twice daily for 10 days.  Patient is to follow-up with her PCP for medical clearance to have her dental procedure done.  She is already seen a dentist yesterday but states she was not given any antibiotics. ____________________________________________   FINAL CLINICAL IMPRESSION(S) / ED DIAGNOSES  Final diagnoses:  Dental caries  Pain, dental  Closed displaced fracture of acromial end of left clavicle with routine healing, subsequent encounter     ED Discharge Orders         Ordered    oxyCODONE-acetaminophen (PERCOCET) 5-325 MG tablet  Every 6 hours PRN     09/17/17 1055    amoxicillin (AMOXIL) 875 MG tablet  2 times daily     09/17/17 1055           Note:  This document was prepared using Dragon voice recognition software and may include unintentional dictation errors.    Tommi RumpsSummers, Kimberley Dastrup L, PA-C 09/17/17 1457    Emily FilbertWilliams, Jonathan E, MD 09/17/17 [phone]

## 2017-09-27 ENCOUNTER — Encounter: Payer: Self-pay | Admitting: Emergency Medicine

## 2017-09-27 ENCOUNTER — Emergency Department
Admission: EM | Admit: 2017-09-27 | Discharge: 2017-09-27 | Disposition: A | Discharge status: Home / Self Care | Payer: Medicare Other | Attending: Emergency Medicine | Admitting: Emergency Medicine

## 2017-09-27 ENCOUNTER — Other Ambulatory Visit: Payer: Self-pay

## 2017-09-27 DIAGNOSIS — E119 Type 2 diabetes mellitus without complications: Secondary | ICD-10-CM | POA: Diagnosis not present

## 2017-09-27 DIAGNOSIS — Z79899 Other long term (current) drug therapy: Secondary | ICD-10-CM | POA: Diagnosis not present

## 2017-09-27 DIAGNOSIS — I10 Essential (primary) hypertension: Secondary | ICD-10-CM | POA: Insufficient documentation

## 2017-09-27 DIAGNOSIS — J45909 Unspecified asthma, uncomplicated: Secondary | ICD-10-CM | POA: Diagnosis not present

## 2017-09-27 DIAGNOSIS — M545 Low back pain, unspecified: Secondary | ICD-10-CM

## 2017-09-27 LAB — BASIC METABOLIC PANEL
Anion gap: 13 (ref 5–15)
BUN: 30 mg/dL — ABNORMAL HIGH (ref 8–23)
CHLORIDE: 95 mmol/L — AB (ref 98–111)
CO2: 24 mmol/L (ref 22–32)
CREATININE: 1.06 mg/dL — AB (ref 0.44–1.00)
Calcium: 9.3 mg/dL (ref 8.9–10.3)
GFR calc non Af Amer: 53 mL/min — ABNORMAL LOW (ref >60)
Glucose, Bld: 95 mg/dL (ref 70–99)
POTASSIUM: 5.1 mmol/L (ref 3.5–5.1)
Sodium: 132 mmol/L — ABNORMAL LOW (ref 135–145)

## 2017-09-27 LAB — CBC
HCT: 33.9 % — ABNORMAL LOW (ref 35.0–47.0)
Hemoglobin: 11.3 g/dL — ABNORMAL LOW (ref 12.0–16.0)
MCH: 26.6 pg (ref 26.0–34.0)
MCHC: 33.4 g/dL (ref 32.0–36.0)
MCV: 79.7 fL — AB (ref 80.0–100.0)
PLATELETS: 746 10*3/uL — AB (ref 150–440)
RBC: 4.25 MIL/uL (ref 3.80–5.20)
RDW: 16.7 % — ABNORMAL HIGH (ref 11.5–14.5)
WBC: 12.5 10*3/uL — AB (ref 3.6–11.0)

## 2017-09-27 LAB — URINALYSIS, COMPLETE (UACMP) WITH MICROSCOPIC
Bacteria, UA: NONE SEEN
Bilirubin Urine: NEGATIVE
Glucose, UA: NEGATIVE mg/dL
Hgb urine dipstick: NEGATIVE
Ketones, ur: NEGATIVE mg/dL
Leukocytes, UA: NEGATIVE
Nitrite: NEGATIVE
Protein, ur: NEGATIVE mg/dL
SPECIFIC GRAVITY, URINE: 1.02 (ref 1.005–1.030)
pH: 7 (ref 5.0–8.0)

## 2017-09-27 MED ORDER — PREDNISONE 20 MG PO TABS: 40.0000 mg | ORAL_TABLET | Freq: Every day | ORAL | 0 refills | Status: DC

## 2017-09-27 MED ORDER — RANITIDINE HCL 150 MG PO CAPS: 150.0000 mg | ORAL_CAPSULE | Freq: Two times a day (BID) | ORAL | 0 refills | Status: DC

## 2017-09-27 NOTE — ED Triage Notes (Signed)
Pt presents with continuing pain since a fall on 09/11/2017. She reports that she has been taking otc meds, using heat therapy, and lumbar support, but that the pain is getting worse. Pt alert & oriented; nad noted.

## 2017-09-27 NOTE — ED Provider Notes (Signed)
[facility] Emergency Department Provider Note  ____________________________________________  Time seen: Approximately 3:19 PM  I have reviewed the triage vital signs and the nursing notes.   HISTORY  Chief Complaint Back Pain    HPI Jaylenne Hamelin is a 67 y.o. female with a history of diabetes hypertension and depression who complains of gradual onset of bilateral low back pain that started ever since a fall 2 weeks ago that resulted in a clavicle injury.   She is been taking NSAIDs and using heat over the past 2 weeks but the pain is not resolving.  She had pain just like this before.  Denies any bowel or bladder incontinence or retention.  No tingling or numbness in the groin.  No lower externally weakness.  No new falls.  No fever or chills.  Pain is worse with movement and change in position, waxing waning, dull, moderate intensity, nonradiating.     Past Medical History:  Diagnosis Date  . Allergy   . Anxiety   . Asthma   . Chronic fatigue   . Clotting disorder (HCC)   . Depression   . Diabetes mellitus without complication (HCC)   . Fibromyalgia   . GERD (gastroesophageal reflux disease)   . Hypertension      Patient Active Problem List   Diagnosis Date Noted  . History of recurrent deep vein thrombosis (DVT) 07/08/2017  . Allergic rhinitis 07/08/2017  . Hypertension   . GERD (gastroesophageal reflux disease)   . Fibromyalgia   . Diabetes mellitus without complication (HCC)   . Depression   . Chronic fatigue   . Asthma   . Anxiety      Past Surgical History:  Procedure Laterality Date  . APPENDECTOMY    . CESAREAN SECTION    . TONSILLECTOMY    . TUBAL LIGATION       Prior to Admission medications   Medication Sig Start Date End Date Taking? Authorizing Provider  amoxicillin (AMOXIL) 875 MG tablet Take 1 tablet (875 mg total) by mouth 2 (two) times daily. 09/17/17   Tommi Rumps, PA-C  ATROVENT HFA 17 MCG/ACT inhaler   07/01/17   [provider]  azelastine (ASTELIN) 0.1 % nasal spray  07/01/17   [provider]  benztropine (COGENTIN) 1 MG tablet Take 1 mg by mouth 2 (two) times daily.    [provider]  candesartan (ATACAND) 32 MG tablet TK 1 T PO QD 06/21/17   [provider]  clonazePAM (KLONOPIN) 1 MG tablet Take 1 mg by mouth at bedtime as needed for anxiety.    [provider]  cloNIDine (CATAPRES - DOSED IN MG/24 HR) 0.1 mg/24hr patch Place 0.1 mg onto the skin once a week.    [provider]  cyclobenzaprine (FLEXERIL) 10 MG tablet Take 10 mg by mouth at bedtime as needed for muscle spasms.    [provider]  escitalopram (LEXAPRO) 20 MG tablet Take 1.5 tablets by mouth at bedtime. 06/26/17   [provider]  fluticasone (FLONASE) 50 MCG/ACT nasal spray Place into both nostrils daily.    [provider]  fluticasone (FLOVENT HFA) 110 MCG/ACT inhaler Inhale into the lungs 2 (two) times daily.    [provider]  fluticasone furoate-vilanterol (BREO ELLIPTA) 200-25 MCG/INH AEPB Inhale 1 puff into the lungs daily.    [provider]  gabapentin (NEURONTIN) 600 MG tablet Take 600 mg by mouth 3 (three) times daily.    [provider]  glycopyrrolate (ROBINUL) 1 MG tablet Take 1 mg by mouth 3 (three) times daily.    [provider]  hydrOXYzine (VISTARIL) 50 MG capsule  07/05/17   [provider]  ibuprofen (ADVIL,MOTRIN) 200 MG tablet Take 400 mg by mouth every 8 (eight) hours as needed.    [provider]  lurasidone (LATUDA) 40 MG TABS tablet Take 160 mg by mouth daily with breakfast.    [provider]  metFORMIN (GLUCOPHAGE) 1000 MG tablet Take 1,000 mg by mouth 2 (two) times daily with a meal.    [provider]  mirtazapine (REMERON) 45 MG tablet Take 45 mg by mouth at bedtime.    [provider]  montelukast (SINGULAIR) 10 MG tablet Take 10 mg by  mouth at bedtime.    [provider]  omeprazole (PRILOSEC) 10 MG capsule  06/29/17   [provider]  oxyCODONE-acetaminophen (PERCOCET) 5-325 MG tablet Take 1 tablet by mouth every 6 (six) hours as needed for severe pain. 09/17/17   Tommi Rumps, PA-C  predniSONE (DELTASONE) 20 MG tablet Take 2 tablets (40 mg total) by mouth daily. 09/27/17   Sharman Cheek, MD  propranolol (INDERAL) 80 MG tablet Take 80 mg by mouth 2 (two) times daily.    [provider]  ranitidine (ZANTAC) 150 MG capsule Take 1 capsule (150 mg total) by mouth 2 (two) times daily. 09/27/17   Sharman Cheek, MD  rivaroxaban (XARELTO) 20 MG TABS tablet Take 20 mg by mouth daily with supper.    [provider]  tiotropium (SPIRIVA) 18 MCG inhalation capsule Place 18 mcg into inhaler and inhale daily.    [provider]     Allergies Lithium   Family History  Problem Relation Age of Onset  . Anxiety disorder Mother   . Heart disease Father 28  . Anxiety disorder Daughter   . Breast cancer Paternal Grandmother 86  . Colon cancer Neg Hx   . Ovarian cancer Neg Hx     Social History Social History   Tobacco Use  . Smoking status: Never Smoker  . Smokeless tobacco: Never Used  Substance Use Topics  . Alcohol use: Never    Frequency: Never  . Drug use: Never    Review of Systems  Constitutional:   No fever or chills.  Cardiovascular:   No chest pain or syncope. Respiratory:   No dyspnea or cough. Gastrointestinal:   Negative for abdominal pain, vomiting and diarrhea.  Musculoskeletal:   Low back pain as above. All other systems reviewed and are negative except as documented above in ROS and HPI.  ____________________________________________   PHYSICAL EXAM:  VITAL SIGNS: ED Triage Vitals  Enc Vitals Group     BP 09/27/17 1204 (!) 149/95     Pulse Rate 09/27/17 1204 79     Resp 09/27/17 1204 16     Temp 09/27/17 1204 99.2 F (37.3 C)     Temp Source  09/27/17 1204 Oral     SpO2 09/27/17 1204 94 %     Weight 09/27/17 1206 200 lb (90.7 kg)     Height 09/27/17 1206 5' (1.524 m)     Head Circumference --      Peak Flow --      Pain Score 09/27/17 1205 8     Pain Loc --      Pain Edu? --      Excl. in GC? --     Vital signs reviewed, nursing assessments  reviewed.   Constitutional:   Alert and oriented. Non-toxic appearance. Eyes:   Conjunctivae are normal. EOMI. PERRL. ENT      Head:   Normocephalic and atraumatic.         Mouth/Throat:   MMM, no pharyngeal erythema. No peritonsillar mass.       Neck:   No meningismus. Full ROM. Cardiovascular:   RRR. Symmetric bilateral radial and DP pulses.  No murmurs. Cap refill less than 2 seconds. Respiratory:   Normal respiratory effort without tachypnea/retractions. Breath sounds are clear and equal bilaterally. No wheezes/rales/rhonchi. Gastrointestinal:   Soft and nontender. Non distended. There is no CVA tenderness.  No rebound, rigidity, or guarding.  Musculoskeletal:   Normal range of motion in all extremities. No joint effusions.  No lower extremity tenderness.  No edema.  No midline spinal tenderness.  Straight leg raise negative Neurologic:   Normal speech and language.  Motor grossly intact. No acute focal neurologic deficits are appreciated.    ____________________________________________    LABS (pertinent positives/negatives) (all labs ordered are listed, but only abnormal results are displayed) Labs Reviewed  URINALYSIS, COMPLETE (UACMP) WITH MICROSCOPIC - Abnormal; Notable for the following components:      Result Value   Color, Urine AMBER (*)    APPearance HAZY (*)    All other components within normal limits  BASIC METABOLIC PANEL - Abnormal; Notable for the following components:   Sodium 132 (*)    Chloride 95 (*)    BUN 30 (*)    Creatinine, Ser 1.06 (*)    GFR calc non Af Amer 53 (*)    All other components within normal limits  CBC - Abnormal; Notable for  the following components:   WBC 12.5 (*)    Hemoglobin 11.3 (*)    HCT 33.9 (*)    MCV 79.7 (*)    RDW 16.7 (*)    Platelets 746 (*)    All other components within normal limits   ____________________________________________   EKG    ____________________________________________    RADIOLOGY  No results found.  ____________________________________________   PROCEDURES Procedures  ____________________________________________    CLINICAL IMPRESSION / ASSESSMENT AND PLAN / ED COURSE  Pertinent labs & imaging results that were available during my care of the patient were reviewed by me and considered in my medical decision making (see chart for details).    Patient nontoxic, presents with unremarkable vital signs.  Reports that this is a typical pain for her that usually resolves with a 3-day course of prednisone.  I doubt spinal stenosis, cauda equina syndrome, epidural abscess, epidural hematoma, spinal fracture, myelitis, hematoma, or intra-abdominal pathology.  Continue NSAIDs, add on prednisone.  Add on H2 blocker for gastric protection.  No indication for imaging including MRI today.  Follow-up with primary care.      ____________________________________________   FINAL CLINICAL IMPRESSION(S) / ED DIAGNOSES    Final diagnoses:  Bilateral low back pain without sciatica, unspecified chronicity     ED Discharge Orders         Ordered    ranitidine (ZANTAC) 150 MG capsule  2 times daily     09/27/17 1518    predniSONE (DELTASONE) 20 MG tablet  Daily     09/27/17 1518          Portions of this note were generated with dragon dictation software. Dictation errors may occur despite best attempts at proofreading.    Sharman Cheek, MD 09/27/17 [phone]

## 2017-09-30 ENCOUNTER — Other Ambulatory Visit: Payer: Self-pay | Admitting: Nurse Practitioner

## 2017-09-30 DIAGNOSIS — Z1231 Encounter for screening mammogram for malignant neoplasm of breast: Secondary | ICD-10-CM

## 2017-10-01 ENCOUNTER — Emergency Department: Payer: Medicare Other

## 2017-10-01 ENCOUNTER — Other Ambulatory Visit: Payer: Self-pay

## 2017-10-01 ENCOUNTER — Emergency Department
Admission: EM | Admit: 2017-10-01 | Discharge: 2017-10-01 | Disposition: A | Discharge status: Home / Self Care | Payer: Medicare Other | Attending: Emergency Medicine | Admitting: Emergency Medicine

## 2017-10-01 ENCOUNTER — Encounter: Payer: Self-pay | Admitting: Emergency Medicine

## 2017-10-01 DIAGNOSIS — E119 Type 2 diabetes mellitus without complications: Secondary | ICD-10-CM | POA: Insufficient documentation

## 2017-10-01 DIAGNOSIS — W19XXXA Unspecified fall, initial encounter: Secondary | ICD-10-CM | POA: Diagnosis not present

## 2017-10-01 DIAGNOSIS — Y939 Activity, unspecified: Secondary | ICD-10-CM | POA: Diagnosis not present

## 2017-10-01 DIAGNOSIS — Z7984 Long term (current) use of oral hypoglycemic drugs: Secondary | ICD-10-CM | POA: Diagnosis not present

## 2017-10-01 DIAGNOSIS — R109 Unspecified abdominal pain: Secondary | ICD-10-CM | POA: Insufficient documentation

## 2017-10-01 DIAGNOSIS — Z7901 Long term (current) use of anticoagulants: Secondary | ICD-10-CM | POA: Diagnosis not present

## 2017-10-01 DIAGNOSIS — Z79899 Other long term (current) drug therapy: Secondary | ICD-10-CM | POA: Insufficient documentation

## 2017-10-01 DIAGNOSIS — Y999 Unspecified external cause status: Secondary | ICD-10-CM | POA: Diagnosis not present

## 2017-10-01 DIAGNOSIS — I10 Essential (primary) hypertension: Secondary | ICD-10-CM | POA: Diagnosis not present

## 2017-10-01 DIAGNOSIS — S22000A Wedge compression fracture of unspecified thoracic vertebra, initial encounter for closed fracture: Secondary | ICD-10-CM | POA: Insufficient documentation

## 2017-10-01 DIAGNOSIS — S299XXA Unspecified injury of thorax, initial encounter: Secondary | ICD-10-CM | POA: Diagnosis present

## 2017-10-01 DIAGNOSIS — J45909 Unspecified asthma, uncomplicated: Secondary | ICD-10-CM | POA: Insufficient documentation

## 2017-10-01 DIAGNOSIS — Y929 Unspecified place or not applicable: Secondary | ICD-10-CM | POA: Insufficient documentation

## 2017-10-01 MED ORDER — OXYCODONE-ACETAMINOPHEN 5-325 MG PO TABS: 1.0000 | ORAL_TABLET | ORAL | 0 refills | Status: DC | PRN

## 2017-10-01 MED ORDER — CYCLOBENZAPRINE HCL 5 MG PO TABS: 5.0000 mg | ORAL_TABLET | Freq: Three times a day (TID) | ORAL | 0 refills | Status: DC | PRN

## 2017-10-01 NOTE — ED Notes (Signed)
EDP at bedside with patient.  

## 2017-10-01 NOTE — ED Notes (Signed)
MD at bedside to update pt on CT results.

## 2017-10-01 NOTE — ED Notes (Signed)
PT has returned to ED from CT. NAD noted at this time.

## 2017-10-01 NOTE — ED Triage Notes (Signed)
First Nurse Note:  C/O fall 3 weeks ago.  Has been seen through ED 3 times for same and PCP x 2.  C/O left low back / flank pain.  Initially told that there were no rib injury and patient has had her urine checked multiple times.  AAOx3.  Skin warm and dry.NAD

## 2017-10-01 NOTE — ED Provider Notes (Signed)
[facility] Emergency Department Provider Note  Time seen: 8:40 AM  I have reviewed the triage vital signs and the nursing notes.   HISTORY  Chief Complaint Flank Pain    HPI Leslie Gould is a 67 y.o. female with a past medical history of anxiety, chronic fatigue, diabetes, fibromyalgia, hypertension, presents to the emergency department with continued left flank pain.  According to the patient she had a fall 3 weeks ago landing on the left side.  Has been seen here multiple times for the same fall as well as seen by [facility].  Patient did suffer a left distal clavicle fracture no other findings however.  She continues to have pain in her left flank states it was severe 2 to 3 weeks ago after the fall however it seemed to go away however over the past several days it has worsened once again.  Denies any urinary symptoms such as hematuria or dysuria.  No history of kidney stones.  No nausea, vomiting, diarrhea.  States she is prescribed Flexeril one time at night which she states does help however she does not have any relief during the day.  Has been using Tylenol and ibuprofen without significant relief.  Describes her pain currently as moderate sharp pains in her left flank.   Past Medical History:  Diagnosis Date  . Allergy   . Anxiety   . Asthma   . Chronic fatigue   . Clotting disorder (HCC)   . Depression   . Diabetes mellitus without complication (HCC)   . Fibromyalgia   . GERD (gastroesophageal reflux disease)   . Hypertension     Patient Active Problem List   Diagnosis Date Noted  . History of recurrent deep vein thrombosis (DVT) 07/08/2017  . Allergic rhinitis 07/08/2017  . Hypertension   . GERD (gastroesophageal reflux disease)   . Fibromyalgia   . Diabetes mellitus without complication (HCC)   . Depression   . Chronic fatigue   . Asthma   . Anxiety     Past Surgical History:  Procedure Laterality Date  . APPENDECTOMY    .  CESAREAN SECTION    . TONSILLECTOMY    . TUBAL LIGATION      Prior to Admission medications   Medication Sig Start Date End Date Taking? Authorizing Provider  amoxicillin (AMOXIL) 875 MG tablet Take 1 tablet (875 mg total) by mouth 2 (two) times daily. 09/17/17   Tommi RumpsSummers, Rhonda L, PA-C  ATROVENT HFA 17 MCG/ACT inhaler  07/01/17   [provider]  azelastine (ASTELIN) 0.1 % nasal spray  07/01/17   [provider]  benztropine (COGENTIN) 1 MG tablet Take 1 mg by mouth 2 (two) times daily.    [provider]  candesartan (ATACAND) 32 MG tablet TK 1 T PO QD 06/21/17   [provider]  clonazePAM (KLONOPIN) 1 MG tablet Take 1 mg by mouth at bedtime as needed for anxiety.    [provider]  cloNIDine (CATAPRES - DOSED IN MG/24 HR) 0.1 mg/24hr patch Place 0.1 mg onto the skin once a week.    [provider]  cyclobenzaprine (FLEXERIL) 10 MG tablet Take 10 mg by mouth at bedtime as needed for muscle spasms.    [provider]  escitalopram (LEXAPRO) 20 MG tablet Take 1.5 tablets by mouth at bedtime. 06/26/17   [provider]  fluticasone (FLONASE) 50 MCG/ACT nasal spray Place into both nostrils daily.    [provider]  fluticasone (  FLOVENT HFA) 110 MCG/ACT inhaler Inhale into the lungs 2 (two) times daily.    [provider]  fluticasone furoate-vilanterol (BREO ELLIPTA) 200-25 MCG/INH AEPB Inhale 1 puff into the lungs daily.    [provider]  gabapentin (NEURONTIN) 600 MG tablet Take 600 mg by mouth 3 (three) times daily.    [provider]  glycopyrrolate (ROBINUL) 1 MG tablet Take 1 mg by mouth 3 (three) times daily.    [provider]  hydrOXYzine (VISTARIL) 50 MG capsule  07/05/17   [provider]  ibuprofen (ADVIL,MOTRIN) 200 MG tablet Take 400 mg by mouth every 8 (eight) hours as needed.    [provider]  lurasidone (LATUDA) 40 MG TABS tablet Take 160 mg by  mouth daily with breakfast.    [provider]  metFORMIN (GLUCOPHAGE) 1000 MG tablet Take 1,000 mg by mouth 2 (two) times daily with a meal.    [provider]  mirtazapine (REMERON) 45 MG tablet Take 45 mg by mouth at bedtime.    [provider]  montelukast (SINGULAIR) 10 MG tablet Take 10 mg by mouth at bedtime.    [provider]  omeprazole (PRILOSEC) 10 MG capsule  06/29/17   [provider]  oxyCODONE-acetaminophen (PERCOCET) 5-325 MG tablet Take 1 tablet by mouth every 6 (six) hours as needed for severe pain. 09/17/17   Tommi Rumps, PA-C  predniSONE (DELTASONE) 20 MG tablet Take 2 tablets (40 mg total) by mouth daily. 09/27/17   Sharman Cheek, MD  propranolol (INDERAL) 80 MG tablet Take 80 mg by mouth 2 (two) times daily.    [provider]  ranitidine (ZANTAC) 150 MG capsule Take 1 capsule (150 mg total) by mouth 2 (two) times daily. 09/27/17   Sharman Cheek, MD  rivaroxaban (XARELTO) 20 MG TABS tablet Take 20 mg by mouth daily with supper.    [provider]  tiotropium (SPIRIVA) 18 MCG inhalation capsule Place 18 mcg into inhaler and inhale daily.    [provider]    Allergies  Allergen Reactions  . Lithium Other (See Comments)    Tremors, nausea    Family History  Problem Relation Age of Onset  . Anxiety disorder Mother   . Heart disease Father 90  . Anxiety disorder Daughter   . Breast cancer Paternal Grandmother 52  . Colon cancer Neg Hx   . Ovarian cancer Neg Hx     Social History Social History   Tobacco Use  . Smoking status: Never Smoker  . Smokeless tobacco: Never Used  Substance Use Topics  . Alcohol use: Never    Frequency: Never  . Drug use: Never    Review of Systems Constitutional: Negative for fever. Cardiovascular: Negative for chest pain. Respiratory: Negative for shortness of breath. Gastrointestinal: Left flank/left back pain.  Negative for nausea vomiting or  diarrhea Genitourinary: Negative for urinary compaints Musculoskeletal: Left lower back pain. Skin: Negative for skin complaints  Neurological: Negative for headache All other ROS negative  ____________________________________________   PHYSICAL EXAM:  VITAL SIGNS: ED Triage Vitals  Enc Vitals Group     BP 10/01/17 0830 (!) 152/102     Pulse Rate 10/01/17 0830 77     Resp 10/01/17 0830 18     Temp 10/01/17 0830 (!) 97.4 F (36.3 C)     Temp Source 10/01/17 0830 Oral     SpO2 10/01/17 0830 96 %     Weight 10/01/17 0828 198 lb 6.6  oz (90 kg)     Height 10/01/17 0828 5' (1.524 m)     Head Circumference --      Peak Flow --      Pain Score 10/01/17 0827 8     Pain Loc --      Pain Edu? --      Excl. in GC? --    Constitutional: Alert and oriented. Well appearing and in no distress. Eyes: Normal exam ENT   Head: Normocephalic and atraumatic.   Mouth/Throat: Mucous membranes are moist. Cardiovascular: Normal rate, regular rhythm. No murmur Respiratory: Normal respiratory effort without tachypnea nor retractions. Breath sounds are clear  Gastrointestinal: Soft and nontender. No distention.  Slight left CVA tenderness to palpation. Musculoskeletal: Nontender with normal range of motion in all extremities. Neurologic:  Normal speech and language. No gross focal neurologic deficits Skin:  Skin is warm, dry and intact.  Psychiatric: Mood and affect are normal.   ____________________________________________    RADIOLOGY  CT shows T10 and T11 compression fractures.  ____________________________________________   INITIAL IMPRESSION / ASSESSMENT AND PLAN / ED COURSE  Pertinent labs & imaging results that were available during my care of the patient were reviewed by me and considered in my medical decision making (see chart for details).  Patient presents emergency department with continued left flank pain ever since her fall 3 weeks ago.  Patient has been seen in the  emergency department for the same.  States she has had a urine checked multiple times which is normal.  Has had normal blood work, had x-rays performed showing a left clavicle fracture is the only finding.  I reviewed the patient's medical records and this is largely accurate.  Given the ongoing pain in the left flank will obtain CT imaging of the abdomen/pelvis to help further evaluate and rule out lower rib fracture, transverse process or vertebral fracture, ureterolithiasis.  Symptoms could be due to muscular strain as well.  CT scan shows T10/T11 compression fractures.  This is likely because of the patient's continued pain.  Patient does not want any pain medication as she does not like the way it makes her feel.  We will increase her Flexeril from nightly to every 8 hours as needed the patient will continue to use ibuprofen or Tylenol for discomfort.  We will have the patient follow-up with orthopedics/Dr. Rosita Kea for intervention consideration if she continues to have pain.  ____________________________________________   FINAL CLINICAL IMPRESSION(S) / ED DIAGNOSES  Left flank pain Compression fracture   Minna Antis, MD 10/01/17 [phone]

## 2017-10-01 NOTE — ED Notes (Signed)
Patient transported to CT 

## 2017-10-08 ENCOUNTER — Ambulatory Visit: Payer: Medicare Other | Admitting: Family Medicine

## 2017-11-25 ENCOUNTER — Ambulatory Visit
Admission: RE | Admit: 2017-11-25 | Discharge: 2017-11-25 | Disposition: A | Discharge status: Home / Self Care | Payer: Medicare Other | Source: Ambulatory Visit | Attending: Nurse Practitioner | Admitting: Nurse Practitioner

## 2017-11-25 ENCOUNTER — Other Ambulatory Visit: Payer: Self-pay | Admitting: Nurse Practitioner

## 2017-11-25 DIAGNOSIS — Z1231 Encounter for screening mammogram for malignant neoplasm of breast: Secondary | ICD-10-CM

## 2018-06-18 ENCOUNTER — Encounter: Payer: Self-pay | Admitting: Emergency Medicine

## 2018-06-18 ENCOUNTER — Other Ambulatory Visit: Payer: Self-pay

## 2018-06-18 DIAGNOSIS — E871 Hypo-osmolality and hyponatremia: Secondary | ICD-10-CM | POA: Insufficient documentation

## 2018-06-18 DIAGNOSIS — Z5321 Procedure and treatment not carried out due to patient leaving prior to being seen by health care provider: Secondary | ICD-10-CM | POA: Diagnosis not present

## 2018-06-18 DIAGNOSIS — R51 Headache: Secondary | ICD-10-CM | POA: Diagnosis not present

## 2018-06-18 DIAGNOSIS — R531 Weakness: Secondary | ICD-10-CM | POA: Diagnosis present

## 2018-06-18 DIAGNOSIS — J45909 Unspecified asthma, uncomplicated: Secondary | ICD-10-CM | POA: Insufficient documentation

## 2018-06-18 DIAGNOSIS — Z7901 Long term (current) use of anticoagulants: Secondary | ICD-10-CM | POA: Diagnosis not present

## 2018-06-18 DIAGNOSIS — W19XXXA Unspecified fall, initial encounter: Secondary | ICD-10-CM | POA: Insufficient documentation

## 2018-06-18 DIAGNOSIS — Z79899 Other long term (current) drug therapy: Secondary | ICD-10-CM | POA: Insufficient documentation

## 2018-06-18 DIAGNOSIS — Z1159 Encounter for screening for other viral diseases: Secondary | ICD-10-CM | POA: Diagnosis not present

## 2018-06-18 DIAGNOSIS — I10 Essential (primary) hypertension: Secondary | ICD-10-CM | POA: Diagnosis not present

## 2018-06-18 LAB — COMPREHENSIVE METABOLIC PANEL
ALT: 17 U/L (ref 0–44)
AST: 15 U/L (ref 15–41)
Albumin: 3.6 g/dL (ref 3.5–5.0)
Alkaline Phosphatase: 84 U/L (ref 38–126)
Anion gap: 9 (ref 5–15)
BUN: 18 mg/dL (ref 8–23)
CO2: 27 mmol/L (ref 22–32)
Calcium: 9.4 mg/dL (ref 8.9–10.3)
Chloride: 89 mmol/L — ABNORMAL LOW (ref 98–111)
Creatinine, Ser: 0.98 mg/dL (ref 0.44–1.00)
GFR calc Af Amer: >60 mL/min (ref >60)
GFR calc non Af Amer: 59 mL/min — ABNORMAL LOW (ref >60)
Glucose, Bld: 138 mg/dL — ABNORMAL HIGH (ref 70–99)
Potassium: 4.4 mmol/L (ref 3.5–5.1)
Sodium: 125 mmol/L — ABNORMAL LOW (ref 135–145)
Total Bilirubin: 0.6 mg/dL (ref 0.3–1.2)
Total Protein: 7 g/dL (ref 6.5–8.1)

## 2018-06-18 LAB — URINALYSIS, COMPLETE (UACMP) WITH MICROSCOPIC
Bacteria, UA: NONE SEEN
Bilirubin Urine: NEGATIVE
Glucose, UA: NEGATIVE mg/dL
Hgb urine dipstick: NEGATIVE
Ketones, ur: NEGATIVE mg/dL
Leukocytes,Ua: NEGATIVE
Nitrite: NEGATIVE
Protein, ur: NEGATIVE mg/dL
Specific Gravity, Urine: 1.011 (ref 1.005–1.030)
pH: 7 (ref 5.0–8.0)

## 2018-06-18 LAB — CBC
HCT: 33.6 % — ABNORMAL LOW (ref 36.0–46.0)
Hemoglobin: 11.3 g/dL — ABNORMAL LOW (ref 12.0–15.0)
MCH: 27.1 pg (ref 26.0–34.0)
MCHC: 33.6 g/dL (ref 30.0–36.0)
MCV: 80.6 fL (ref 80.0–100.0)
Platelets: 411 10*3/uL — ABNORMAL HIGH (ref 150–400)
RBC: 4.17 MIL/uL (ref 3.87–5.11)
RDW: 14.8 % (ref 11.5–15.5)
WBC: 11.2 10*3/uL — ABNORMAL HIGH (ref 4.0–10.5)
nRBC: 0 % (ref 0.0–0.2)

## 2018-06-18 LAB — TROPONIN I: Troponin I: <0.03 ng/mL (ref <0.03)

## 2018-06-18 MED ORDER — SODIUM CHLORIDE 0.9% FLUSH: 3.0000 mL | Freq: Once | INTRAVENOUS | Status: DC

## 2018-06-18 NOTE — ED Triage Notes (Signed)
States has had general weakness and has fallen x 3 over past 3 weeks. Denies headaches or fevers.

## 2018-06-18 NOTE — ED Notes (Signed)
Pt resting in chair. No c/o pain.

## 2018-06-19 ENCOUNTER — Emergency Department: Payer: Medicare Other

## 2018-06-19 ENCOUNTER — Other Ambulatory Visit: Payer: Self-pay

## 2018-06-19 ENCOUNTER — Emergency Department
Admission: EM | Admit: 2018-06-19 | Discharge: 2018-06-19 | Disposition: A | Discharge status: Home / Self Care | Payer: Medicare Other | Attending: Emergency Medicine | Admitting: Emergency Medicine

## 2018-06-19 ENCOUNTER — Emergency Department
Admission: EM | Admit: 2018-06-19 | Discharge: 2018-06-19 | Disposition: A | Discharge status: Home / Self Care | Payer: Medicare Other | Source: Home / Self Care | Attending: Emergency Medicine | Admitting: Emergency Medicine

## 2018-06-19 DIAGNOSIS — R51 Headache: Secondary | ICD-10-CM | POA: Insufficient documentation

## 2018-06-19 DIAGNOSIS — Z7901 Long term (current) use of anticoagulants: Secondary | ICD-10-CM | POA: Insufficient documentation

## 2018-06-19 DIAGNOSIS — W19XXXA Unspecified fall, initial encounter: Secondary | ICD-10-CM | POA: Insufficient documentation

## 2018-06-19 DIAGNOSIS — Z79899 Other long term (current) drug therapy: Secondary | ICD-10-CM | POA: Insufficient documentation

## 2018-06-19 DIAGNOSIS — I10 Essential (primary) hypertension: Secondary | ICD-10-CM | POA: Insufficient documentation

## 2018-06-19 DIAGNOSIS — Z1159 Encounter for screening for other viral diseases: Secondary | ICD-10-CM | POA: Insufficient documentation

## 2018-06-19 DIAGNOSIS — E871 Hypo-osmolality and hyponatremia: Secondary | ICD-10-CM | POA: Insufficient documentation

## 2018-06-19 DIAGNOSIS — R531 Weakness: Secondary | ICD-10-CM | POA: Diagnosis not present

## 2018-06-19 DIAGNOSIS — J45909 Unspecified asthma, uncomplicated: Secondary | ICD-10-CM | POA: Insufficient documentation

## 2018-06-19 LAB — CBC WITH DIFFERENTIAL/PLATELET
Abs Immature Granulocytes: 0.06 K/uL (ref 0.00–0.07)
Basophils Absolute: 0 K/uL (ref 0.0–0.1)
Basophils Relative: 0 %
Eosinophils Absolute: 0.7 K/uL — ABNORMAL HIGH (ref 0.0–0.5)
Eosinophils Relative: 6 %
HCT: 33.3 % — ABNORMAL LOW (ref 36.0–46.0)
Hemoglobin: 11 g/dL — ABNORMAL LOW (ref 12.0–15.0)
Immature Granulocytes: 1 %
Lymphocytes Relative: 19 %
Lymphs Abs: 2.5 K/uL (ref 0.7–4.0)
MCH: 26.7 pg (ref 26.0–34.0)
MCHC: 33 g/dL (ref 30.0–36.0)
MCV: 80.8 fL (ref 80.0–100.0)
Monocytes Absolute: 1.2 K/uL — ABNORMAL HIGH (ref 0.1–1.0)
Monocytes Relative: 9 %
Neutro Abs: 8.4 K/uL — ABNORMAL HIGH (ref 1.7–7.7)
Neutrophils Relative %: 65 %
Platelets: 426 K/uL — ABNORMAL HIGH (ref 150–400)
RBC: 4.12 MIL/uL (ref 3.87–5.11)
RDW: 14.6 % (ref 11.5–15.5)
WBC: 12.9 K/uL — ABNORMAL HIGH (ref 4.0–10.5)
nRBC: 0 % (ref 0.0–0.2)

## 2018-06-19 LAB — COMPREHENSIVE METABOLIC PANEL
ALT: 18 U/L (ref 0–44)
AST: 14 U/L — ABNORMAL LOW (ref 15–41)
Albumin: 3.9 g/dL (ref 3.5–5.0)
Alkaline Phosphatase: 83 U/L (ref 38–126)
Anion gap: 9 (ref 5–15)
BUN: 18 mg/dL (ref 8–23)
CO2: 26 mmol/L (ref 22–32)
Calcium: 9.1 mg/dL (ref 8.9–10.3)
Chloride: 87 mmol/L — ABNORMAL LOW (ref 98–111)
Creatinine, Ser: 0.85 mg/dL (ref 0.44–1.00)
GFR calc Af Amer: >60 mL/min (ref >60)
GFR calc non Af Amer: >60 mL/min (ref >60)
Glucose, Bld: 115 mg/dL — ABNORMAL HIGH (ref 70–99)
Potassium: 4.2 mmol/L (ref 3.5–5.1)
Sodium: 122 mmol/L — ABNORMAL LOW (ref 135–145)
Total Bilirubin: 0.6 mg/dL (ref 0.3–1.2)
Total Protein: 7.3 g/dL (ref 6.5–8.1)

## 2018-06-19 LAB — URINALYSIS, COMPLETE (UACMP) WITH MICROSCOPIC
Bacteria, UA: NONE SEEN
Bilirubin Urine: NEGATIVE
Glucose, UA: NEGATIVE mg/dL
Hgb urine dipstick: NEGATIVE
Ketones, ur: 5 mg/dL — AB
Leukocytes,Ua: NEGATIVE
Nitrite: NEGATIVE
Protein, ur: NEGATIVE mg/dL
Specific Gravity, Urine: 1.013 (ref 1.005–1.030)
pH: 7 (ref 5.0–8.0)

## 2018-06-19 LAB — TROPONIN I: Troponin I: <0.03 ng/mL

## 2018-06-19 LAB — BRAIN NATRIURETIC PEPTIDE: B Natriuretic Peptide: 136 pg/mL — ABNORMAL HIGH (ref 0.0–100.0)

## 2018-06-19 LAB — SARS CORONAVIRUS 2 BY RT PCR (HOSPITAL ORDER, PERFORMED IN ~~LOC~~ HOSPITAL LAB): SARS Coronavirus 2: NEGATIVE

## 2018-06-19 NOTE — ED Provider Notes (Addendum)
[facility] Emergency Department Provider Note   ____________________________________________   First MD Initiated Contact with Patient 06/19/18 1327     (approximate)  I have reviewed the triage vital signs and the nursing notes.   HISTORY  Chief Complaint Weakness   HPI Leslie Gould is a 68 y.o. female who told the triage nurse she is fallen 3 times and has is weak all over.  She tells me she fell once hit her head very hard on her head is not felt right since it still achy a little bit she says she is not weak.  Headache currently is mild her head just does not feel right.  She has no neck pain.       \ Past Medical History:  Diagnosis Date  . Allergy   . Anxiety   . Asthma   . Chronic fatigue   . Clotting disorder (HCC)   . Depression   . Diabetes mellitus without complication (HCC)   . Fibromyalgia   . GERD (gastroesophageal reflux disease)   . Hypertension     Patient Active Problem List   Diagnosis Date Noted  . History of recurrent deep vein thrombosis (DVT) 07/08/2017  . Allergic rhinitis 07/08/2017  . Hypertension   . GERD (gastroesophageal reflux disease)   . Fibromyalgia   . Diabetes mellitus without complication (HCC)   . Depression   . Chronic fatigue   . Asthma   . Anxiety     Past Surgical History:  Procedure Laterality Date  . APPENDECTOMY    . AUGMENTATION MAMMAPLASTY Bilateral 1980  . CESAREAN SECTION    . TONSILLECTOMY    . TUBAL LIGATION      Prior to Admission medications   Medication Sig Start Date End Date Taking? Authorizing Provider  amoxicillin (AMOXIL) 875 MG tablet Take 1 tablet (875 mg total) by mouth 2 (two) times daily. 09/17/17   Tommi RumpsSummers, Rhonda L, PA-C  ATROVENT HFA 17 MCG/ACT inhaler  07/01/17   [provider]  azelastine (ASTELIN) 0.1 % nasal spray  07/01/17   [provider]  benztropine (COGENTIN) 1 MG tablet Take 1 mg by mouth 2 (two) times daily.    [provider]  candesartan (ATACAND) 32 MG tablet TK 1 T PO QD 06/21/17   [provider]  clonazePAM (KLONOPIN) 1 MG tablet Take 1 mg by mouth at bedtime as needed for anxiety.    [provider]  cloNIDine (CATAPRES - DOSED IN MG/24 HR) 0.1 mg/24hr patch Place 0.1 mg onto the skin once a week.    [provider]  cyclobenzaprine (FLEXERIL) 5 MG tablet Take 1 tablet (5 mg total) by mouth 3 (three) times daily as needed for muscle spasms. 10/01/17   Minna AntisPaduchowski, Kevin, MD  escitalopram (LEXAPRO) 20 MG tablet Take 1.5 tablets by mouth at bedtime. 06/26/17   [provider]  fluticasone (FLONASE) 50 MCG/ACT nasal spray Place into both nostrils daily.    [provider]  fluticasone (FLOVENT HFA) 110 MCG/ACT inhaler Inhale into the lungs 2 (two) times daily.    [provider]  fluticasone furoate-vilanterol (BREO ELLIPTA) 200-25 MCG/INH AEPB Inhale 1 puff into the lungs daily.    [provider]  gabapentin (NEURONTIN) 600 MG tablet Take 600 mg by mouth 3 (three) times daily.    [provider]  glycopyrrolate (ROBINUL) 1 MG tablet Take 1 mg by mouth 3 (three) times daily.    [provider]  hydrOXYzine (  VISTARIL) 50 MG capsule  07/05/17   [provider]  ibuprofen (ADVIL,MOTRIN) 200 MG tablet Take 400 mg by mouth every 8 (eight) hours as needed.    [provider]  lurasidone (LATUDA) 40 MG TABS tablet Take 160 mg by mouth daily with breakfast.    [provider]  metFORMIN (GLUCOPHAGE) 1000 MG tablet Take 1,000 mg by mouth 2 (two) times daily with a meal.    [provider]  mirtazapine (REMERON) 45 MG tablet Take 45 mg by mouth at bedtime.    [provider]  montelukast (SINGULAIR) 10 MG tablet Take 10 mg by mouth at bedtime.    [provider]  omeprazole (PRILOSEC) 10 MG capsule  06/29/17   [provider]  oxyCODONE-acetaminophen (PERCOCET) 5-325 MG tablet  Take 1 tablet by mouth every 4 (four) hours as needed for severe pain. 10/01/17   Minna Antis, MD  predniSONE (DELTASONE) 20 MG tablet Take 2 tablets (40 mg total) by mouth daily. 09/27/17   Sharman Cheek, MD  propranolol (INDERAL) 80 MG tablet Take 80 mg by mouth 2 (two) times daily.    [provider]  ranitidine (ZANTAC) 150 MG capsule Take 1 capsule (150 mg total) by mouth 2 (two) times daily. 09/27/17   Sharman Cheek, MD  rivaroxaban (XARELTO) 20 MG TABS tablet Take 20 mg by mouth daily with supper.    [provider]  tiotropium (SPIRIVA) 18 MCG inhalation capsule Place 18 mcg into inhaler and inhale daily.    [provider]    Allergies Lithium  Family History  Problem Relation Age of Onset  . Anxiety disorder Mother   . Heart disease Father 87  . Anxiety disorder Daughter   . Breast cancer Paternal Grandmother 39  . Breast cancer Cousin   . Colon cancer Neg Hx   . Ovarian cancer Neg Hx     Social History Social History   Tobacco Use  . Smoking status: Never Smoker  . Smokeless tobacco: Never Used  Substance Use Topics  . Alcohol use: Never    Frequency: Never  . Drug use: Never    Review of Systems  Constitutional: No fever/chills Eyes: No visual changes. ENT: No sore throat. Cardiovascular: Denies chest pain. Respiratory: Denies shortness of breath. Gastrointestinal: No abdominal pain.  No nausea, no vomiting.  No diarrhea.  No constipation. Genitourinary: Negative for dysuria. Musculoskeletal: Negative for back pain. Skin: Negative for rash. Neurological: Negative for headaches, focal weakness   ____________________________________________   PHYSICAL EXAM:  VITAL SIGNS: ED Triage Vitals  Enc Vitals Group     BP 06/19/18 0903 (!) 134/91     Pulse Rate 06/19/18 0903 73     Resp 06/19/18 0903 18     Temp 06/19/18 0903 97.9 F (36.6 C)     Temp Source 06/19/18 0903 Oral     SpO2 06/19/18 0903 99 %     Weight  06/19/18 0905 200 lb (90.7 kg)     Height 06/19/18 0905 5' (1.524 m)     Head Circumference --      Peak Flow --      Pain Score 06/19/18 0905 0     Pain Loc --      Pain Edu? --      Excl. in GC? --     Constitutional: Alert and oriented. Well appearing and in no acute distress. Eyes: Conjunctivae are normal. PER. EOMI. Head: Atraumatic. Nose: No congestion/rhinnorhea. Mouth/Throat: Mucous membranes are  moist.  Oropharynx non-erythematous. Neck: No stridor.  No cervical spine tenderness to palpation. Cardiovascular: Normal rate, regular rhythm. Grossly normal heart sounds.  Good peripheral circulation. Respiratory: Normal respiratory effort.  No retractions. Lungs CTAB. Gastrointestinal: Soft and nontender. No distention. No abdominal bruits. No CVA tenderness. Musculoskeletal: No lower extremity tenderness nor edema.   Neurologic:  Normal speech and language. No gross focal neurologic deficits are appreciated.  Cranial nerves II through XII are intact although visual fields were not checked cerebellar finger-nose and rapid alternating movements and hands are normal motor strength is 5/5 throughout patient does not report any numbness. Skin:  Skin is warm, dry and intact. No rash noted. Psychiatric: Mood and affect are normal. Speech and behavior are normal.  ____________________________________________   LABS (all labs ordered are listed, but only abnormal results are displayed)  Labs Reviewed  COMPREHENSIVE METABOLIC PANEL - Abnormal; Notable for the following components:      Result Value   Sodium 122 (*)    Chloride 87 (*)    Glucose, Bld 115 (*)    AST 14 (*)    All other components within normal limits  BRAIN NATRIURETIC PEPTIDE - Abnormal; Notable for the following components:   B Natriuretic Peptide 136.0 (*)    All other components within normal limits  URINALYSIS, COMPLETE (UACMP) WITH MICROSCOPIC - Abnormal; Notable for the following components:   Color, Urine  YELLOW (*)    APPearance CLEAR (*)    Ketones, ur 5 (*)    All other components within normal limits  CBC WITH DIFFERENTIAL/PLATELET - Abnormal; Notable for the following components:   WBC 12.9 (*)    Hemoglobin 11.0 (*)    HCT 33.3 (*)    Platelets 426 (*)    Neutro Abs 8.4 (*)    Monocytes Absolute 1.2 (*)    Eosinophils Absolute 0.7 (*)    All other components within normal limits  SARS CORONAVIRUS 2 (HOSPITAL ORDER, PERFORMED IN  HOSPITAL LAB)  TROPONIN I  CBC WITH DIFFERENTIAL/PLATELET   ____________________________________________  EKG   ____________________________________________  RADIOLOGY  ED MD interpretation:   Official radiology report(s): Ct Head Wo Contrast  Result Date: 06/19/2018 CLINICAL DATA:  Diffuse weakness EXAM: CT HEAD WITHOUT CONTRAST TECHNIQUE: Contiguous axial images were obtained from the base of the skull through the vertex without intravenous contrast. COMPARISON:  None. FINDINGS: Brain: No acute intracranial hemorrhage. No focal mass lesion. No CT evidence of acute infarction. No midline shift or mass effect. No hydrocephalus. Basilar cisterns are patent. Mild frontal atrophy. Vascular: No hyperdense vessel or unexpected calcification. Skull: Normal. Negative for fracture or focal lesion. Sinuses/Orbits: Paranasal sinuses and mastoid air cells are clear. Orbits are clear. Other: None. IMPRESSION: No acute intracranial findings. Electronically Signed   By: Genevive Bi M.D.   On: 06/19/2018 14:00   Dg Chest Portable 1 View  Result Date: 06/19/2018 CLINICAL DATA:  Weakness and cough for 3 weeks. EXAM: PORTABLE CHEST 1 VIEW COMPARISON:  Single-view of the chest 09/11/2017. FINDINGS: Lungs clear. Heart size normal. No pneumothorax or pleural effusion. No acute bony abnormality. Right shoulder replacement noted. IMPRESSION: No acute disease. Electronically Signed   By: Drusilla Kanner M.D.   On: 06/19/2018 14:03     ____________________________________________   PROCEDURES  Procedure(s) performed (including Critical Care):  Procedures   ____________________________________________   INITIAL IMPRESSION / ASSESSMENT AND PLAN / ED COURSE      Is hyponatremic.  She is 122 now she is 125  yesterday 132 several days ago she is dropping fairly rapidly.  I believe she needs to be in the hospital.   ----------------------------------------- 4:07 PM on 06/19/2018 -----------------------------------------  Patient says she cannot stay in the hospital.  She refuses.  She is well aware of the safety issues and the fact that she could become seriously ill if she does not stay.  She promises to come back tomorrow and will add extra salt to her diet.       ____________________________________________   FINAL CLINICAL IMPRESSION(S) / ED DIAGNOSES  Final diagnoses:  Hyponatremia     ED Discharge Orders    None       Note:  This document was prepared using Dragon voice recognition software and may include unintentional dictation errors.    Arnaldo Natal, MD 06/19/18 1520    Arnaldo Natal, MD 06/19/18 [phone]

## 2018-06-19 NOTE — ED Notes (Signed)
Walked pt to exit and beyond until pt stopped. Pt had stated she did ot drive self to the ED today but was unwilling to walk further toward her vehicle. Pt stated she was getting a ride and when asked if her car was in the parking lot she stated "I'm not clairvoyant." Pt very clearly did not walk to be escorted to her car. Pt left at approximately halfway up sidewalk to parking lot. After this RN walked away, pt began to walk toward parking lot w/o escort. Pt was clearly informed that her condition was severe and concerning and she was risking death by leaving w/o treatment. Pt brushed it off verbally with an awkward laugh. Sharee Pimple that pt was poorly able to make her own judgments about health care at this time. Pt was pale and shaky while being escorted to exit.

## 2018-06-19 NOTE — ED Notes (Signed)
Pt left DC instructions at discharge desk behind the computer.

## 2018-06-19 NOTE — Progress Notes (Signed)
Pastoral Care Visit   06/19/18 1500  Clinical Encounter Type  Visited With Patient  Visit Type Initial;Other (Comment) (HCPOA)  Referral From Nurse  Consult/Referral To Chaplain  Spiritual Encounters  Spiritual Needs Other (Comment)  Stress Factors  Patient Stress Factors Other (Comment) (Wanted to leave)   Leslie Gould was contacted to meet w/ pt regarding HCPOA.  Pt indicated this was her wish.  Chap identified notary in ED and secured ppwk.  Upon meeting back w/ the pt, the pt indicated she did not wish to complete here but would take with her to complete at home.  Leslie Gould explained the need for a notary.  Pt was anxious to depart and did not wish to review the document or complete it at this time.  Leslie Gould, [address]

## 2018-06-19 NOTE — Discharge Instructions (Addendum)
Your sodium is really low in your blood.  It is dangerous for you to go home.  I really wish you would stay but since you cannot I will let you go.  Please make sure you add a little bit of extra salt to your diet today and return here tomorrow for recheck.  It is really not safe for you to go home though.

## 2018-06-19 NOTE — ED Triage Notes (Addendum)
Pt states she was here yesterday for weakness and multiple falls, LWBS. Denies any pain on arrival

## 2018-06-19 NOTE — ED Notes (Signed)
This RN rounded with patient.  Discussed wait times and patient's symptoms.  Patient denies any new symptoms or worsening symptoms.  Patient verbalized understanding of wait time.

## 2018-06-19 NOTE — ED Notes (Signed)
Patient transported to CT 

## 2018-06-19 NOTE — ED Notes (Signed)
Pt is very confused and resistant to remaining in her hospital room. Pt ate food provided and is drinking water. No c/o n/v/d. Pt stated "I keep myself hydrated with salt." Pt does not want to stay in hospital and has stated multiple times she wants to go home. Pt requesting to fill out advanced directives and the chaplain is involved in that.

## 2018-10-10 ENCOUNTER — Other Ambulatory Visit: Payer: Self-pay | Admitting: Family Medicine

## 2018-10-10 DIAGNOSIS — Z1231 Encounter for screening mammogram for malignant neoplasm of breast: Secondary | ICD-10-CM

## 2018-10-27 ENCOUNTER — Emergency Department
Admission: EM | Admit: 2018-10-27 | Discharge: 2018-10-27 | Disposition: A | Discharge status: Home / Self Care | Payer: Medicare Other | Attending: Emergency Medicine | Admitting: Emergency Medicine

## 2018-10-27 ENCOUNTER — Other Ambulatory Visit: Payer: Self-pay

## 2018-10-27 ENCOUNTER — Encounter: Payer: Self-pay | Admitting: Emergency Medicine

## 2018-10-27 ENCOUNTER — Emergency Department: Payer: Medicare Other

## 2018-10-27 DIAGNOSIS — Y999 Unspecified external cause status: Secondary | ICD-10-CM | POA: Diagnosis not present

## 2018-10-27 DIAGNOSIS — W101XXA Fall (on)(from) sidewalk curb, initial encounter: Secondary | ICD-10-CM | POA: Diagnosis not present

## 2018-10-27 DIAGNOSIS — E119 Type 2 diabetes mellitus without complications: Secondary | ICD-10-CM | POA: Insufficient documentation

## 2018-10-27 DIAGNOSIS — Z79899 Other long term (current) drug therapy: Secondary | ICD-10-CM | POA: Diagnosis not present

## 2018-10-27 DIAGNOSIS — S59901A Unspecified injury of right elbow, initial encounter: Secondary | ICD-10-CM | POA: Diagnosis present

## 2018-10-27 DIAGNOSIS — Z7901 Long term (current) use of anticoagulants: Secondary | ICD-10-CM | POA: Insufficient documentation

## 2018-10-27 DIAGNOSIS — S0083XA Contusion of other part of head, initial encounter: Secondary | ICD-10-CM | POA: Diagnosis not present

## 2018-10-27 DIAGNOSIS — I10 Essential (primary) hypertension: Secondary | ICD-10-CM | POA: Diagnosis not present

## 2018-10-27 DIAGNOSIS — S42409A Unspecified fracture of lower end of unspecified humerus, initial encounter for closed fracture: Secondary | ICD-10-CM

## 2018-10-27 DIAGNOSIS — Z7984 Long term (current) use of oral hypoglycemic drugs: Secondary | ICD-10-CM | POA: Diagnosis not present

## 2018-10-27 DIAGNOSIS — S42411A Displaced simple supracondylar fracture without intercondylar fracture of right humerus, initial encounter for closed fracture: Secondary | ICD-10-CM | POA: Insufficient documentation

## 2018-10-27 DIAGNOSIS — D689 Coagulation defect, unspecified: Secondary | ICD-10-CM | POA: Diagnosis not present

## 2018-10-27 DIAGNOSIS — Y92480 Sidewalk as the place of occurrence of the external cause: Secondary | ICD-10-CM | POA: Insufficient documentation

## 2018-10-27 DIAGNOSIS — W19XXXA Unspecified fall, initial encounter: Secondary | ICD-10-CM

## 2018-10-27 DIAGNOSIS — J45909 Unspecified asthma, uncomplicated: Secondary | ICD-10-CM | POA: Insufficient documentation

## 2018-10-27 DIAGNOSIS — Y9301 Activity, walking, marching and hiking: Secondary | ICD-10-CM | POA: Insufficient documentation

## 2018-10-27 MED ORDER — ONDANSETRON HCL 4 MG/2ML IJ SOLN
4.0000 mg | Freq: Once | INTRAMUSCULAR | Status: AC
  Administered 2018-10-27: 17:00:00 4 mg via INTRAVENOUS
  Filled 2018-10-27: qty 2

## 2018-10-27 MED ORDER — HYDROCODONE-ACETAMINOPHEN 5-325 MG PO TABS: 1.0000 | ORAL_TABLET | Freq: Four times a day (QID) | ORAL | 0 refills | Status: DC | PRN

## 2018-10-27 MED ORDER — MORPHINE SULFATE (PF) 4 MG/ML IV SOLN
4.0000 mg | Freq: Once | INTRAVENOUS | Status: AC
  Administered 2018-10-27: 17:00:00 4 mg via INTRAVENOUS
  Filled 2018-10-27: qty 1

## 2018-10-27 MED ORDER — TETANUS-DIPHTH-ACELL PERTUSSIS 5-2.5-18.5 LF-MCG/0.5 IM SUSP
0.5000 mL | Freq: Once | INTRAMUSCULAR | Status: AC
  Administered 2018-10-27: 0.5 mL via INTRAMUSCULAR
  Filled 2018-10-27: qty 0.5

## 2018-10-27 MED ORDER — MORPHINE SULFATE (PF) 4 MG/ML IV SOLN
4.0000 mg | Freq: Once | INTRAVENOUS | Status: AC
  Administered 2018-10-27: 20:00:00 4 mg via INTRAVENOUS
  Filled 2018-10-27: qty 1

## 2018-10-27 NOTE — Discharge Instructions (Signed)
Follow-up with orthopedics.  Please call in the morning for an appointment.  Dr. Marry Guan and Dr. Mack Guise are aware of the fracture.  Please return emergency department if he cannot feel your fingers or they turn blue.  Take your pain medication as needed.

## 2018-10-27 NOTE — ED Provider Notes (Signed)
[facility] ([facility]) Emergency Department Provider Note  ____________________________________________   First MD Initiated Contact with Patient 10/27/18 1647     (approximate)  I have reviewed the triage vital signs and the nursing notes.   HISTORY  Chief Complaint Fall and Elbow Pain    HPI Leslie Gould is a 68 y.o. female presents emergency department after a fall.  She arrived via EMS.  Patient states she tripped off the curb.  Landed on the right arm.  Also hit her head.  Patient is taking Eliquis.  She denies any LOC.  She cannot move her arm.    Past Medical History:  Diagnosis Date   Allergy    Anxiety    Asthma    Chronic fatigue    Clotting disorder (HCC)    Depression    Diabetes mellitus without complication (HCC)    Fibromyalgia    GERD (gastroesophageal reflux disease)    Hypertension     Patient Active Problem List   Diagnosis Date Noted   History of recurrent deep vein thrombosis (DVT) 07/08/2017   Allergic rhinitis 07/08/2017   Hypertension    GERD (gastroesophageal reflux disease)    Fibromyalgia    Diabetes mellitus without complication (HCC)    Depression    Chronic fatigue    Asthma    Anxiety     Past Surgical History:  Procedure Laterality Date   APPENDECTOMY     AUGMENTATION MAMMAPLASTY Bilateral 1980   CESAREAN SECTION     TONSILLECTOMY     TUBAL LIGATION      Prior to Admission medications   Medication Sig Start Date End Date Taking? Authorizing Provider  ATROVENT HFA 17 MCG/ACT inhaler  07/01/17   [provider]  azelastine (ASTELIN) 0.1 % nasal spray  07/01/17   [provider]  benztropine (COGENTIN) 1 MG tablet Take 1 mg by mouth 2 (two) times daily.    [provider]  candesartan (ATACAND) 32 MG tablet TK 1 T PO QD 06/21/17   [provider]  clonazePAM (KLONOPIN) 1 MG tablet Take 1 mg by mouth at bedtime as needed for anxiety.    [provider]  cloNIDine (CATAPRES - DOSED IN MG/24 HR) 0.1 mg/24hr patch Place 0.1 mg onto the skin once a week.    [provider]  escitalopram (LEXAPRO) 20 MG tablet Take 1.5 tablets by mouth at bedtime. 06/26/17   [provider]  fluticasone (FLONASE) 50 MCG/ACT nasal spray Place into both nostrils daily.    [provider]  fluticasone (FLOVENT HFA) 110 MCG/ACT inhaler Inhale into the lungs 2 (two) times daily.    [provider]  fluticasone furoate-vilanterol (BREO ELLIPTA) 200-25 MCG/INH AEPB Inhale 1 puff into the lungs daily.    [provider]  gabapentin (NEURONTIN) 600 MG tablet Take 600 mg by mouth 3 (three) times daily.    [provider]  glycopyrrolate (ROBINUL) 1 MG tablet Take 1 mg by mouth 3 (three) times daily.    [provider]  HYDROcodone-acetaminophen (NORCO/VICODIN) 5-325 MG tablet Take 1 tablet by mouth every 6 (six) hours as needed for moderate pain. 10/27/18   [name], PA-C  hydrOXYzine (VISTARIL) 50 MG capsule  07/05/17   [provider]  ibuprofen (ADVIL,MOTRIN) 200 MG tablet Take 400 mg by mouth every 8 (eight) hours as needed.    [provider]  lurasidone (LATUDA) 40 MG TABS tablet Take 160 mg by mouth daily with  breakfast.    [provider]  metFORMIN (GLUCOPHAGE) 1000 MG tablet Take 1,000 mg by mouth 2 (two) times daily with a meal.    [provider]  mirtazapine (REMERON) 45 MG tablet Take 45 mg by mouth at bedtime.    [provider]  montelukast (SINGULAIR) 10 MG tablet Take 10 mg by mouth at bedtime.    [provider]  omeprazole (PRILOSEC) 10 MG capsule  06/29/17   [provider]  propranolol (INDERAL) 80 MG tablet Take 80 mg by mouth 2 (two) times daily.    [provider]  rivaroxaban (XARELTO) 20 MG TABS tablet Take 20 mg by mouth daily with supper.    [provider]  tiotropium (SPIRIVA) 18 MCG  inhalation capsule Place 18 mcg into inhaler and inhale daily.    [provider]    Allergies Lithium  Family History  Problem Relation Age of Onset   Anxiety disorder Mother    Heart disease Father 6   Anxiety disorder Daughter    Breast cancer Paternal Grandmother 35   Breast cancer Cousin    Colon cancer Neg Hx    Ovarian cancer Neg Hx     Social History Social History   Tobacco Use   Smoking status: Never Smoker   Smokeless tobacco: Never Used  Substance Use Topics   Alcohol use: Never    Frequency: Never   Drug use: Never    Review of Systems  Constitutional: No fever/chills Eyes: No visual changes. ENT: No sore throat. Respiratory: Denies cough Genitourinary: Negative for dysuria. Musculoskeletal: Negative for back pain.  Positive for right arm and elbow pain Skin: Negative for rash.    ____________________________________________   PHYSICAL EXAM:  VITAL SIGNS: ED Triage Vitals  Enc Vitals Group     BP 10/27/18 1650 108/62     Pulse Rate 10/27/18 1650 92     Resp 10/27/18 1650 20     Temp 10/27/18 1650 98.9 F (37.2 C)     Temp Source 10/27/18 1650 Oral     SpO2 10/27/18 1650 94 %     Weight 10/27/18 1651 200 lb (90.7 kg)     Height 10/27/18 1651 5' (1.524 m)     Head Circumference --      Peak Flow --      Pain Score 10/27/18 1651 8     Pain Loc --      Pain Edu? --      Excl. in [place]? --     Constitutional: Alert and oriented. Well appearing and in no acute distress. Eyes: Conjunctivae are normal.  Head: Abrasion to the forehead Nose: No congestion/rhinnorhea. Mouth/Throat: Mucous membranes are moist.   Neck:  supple no lymphadenopathy noted Cardiovascular: Normal rate, regular rhythm. Heart sounds are normal Respiratory: Normal respiratory effort.  No retractions, lungs c t a  GU: deferred Musculoskeletal: Positive deformity of the right elbow.  Neurovascular is intact.  Range of motion was not performed due to  the deformity.   Neurologic:  Normal speech and language.  Skin:  Skin is warm, dry, several abrasions noted . No rash noted. Psychiatric: Mood and affect are normal. Speech and behavior are normal.  ____________________________________________   LABS (all labs ordered are listed, but only abnormal results are displayed)  Labs Reviewed - No data to display ____________________________________________   ____________________________________________  RADIOLOGY  CT of the head is negative X-ray of the right elbow shows a distal humeral fracture which is  comminuted and displaced with multiple fragments  ____________________________________________   PROCEDURES  Procedure(s) performed: Long-arm OCL, sling  Procedures    ____________________________________________   INITIAL IMPRESSION / ASSESSMENT AND PLAN / ED COURSE  Pertinent labs & imaging results that were available during my care of the patient were reviewed by me and considered in my medical decision making (see chart for details).   Patient 68 year old female presents emergency department secondary to a fall.  Unsure of last Tdap.  Last ate at 1 PM.  See HPI  Physical exam shows patient is alert.  Right elbow has positive deformity.  Multiple abrasions.  CT of the head is negative X-ray of the right elbow shows multiple fragments and displacement at the supracondylar level.  Paged Dr. Martha ClanKrasinski. Paged Dr. Martha ClanKrasinski again, had secretarial staff paged him.  Secretarial staff finally called him at home and he returned her call.  Explained fracture to Dr. Martha ClanKrasinski.  He states this can be easily managed as outpatient.  He instructed me to place her in a long-arm OCL in a sling.  He states he will look at the films and call me back if he thinks we need other treatment.  That to give her pain medication.  And that she can follow-up in his office with 1 of the hand specialist.  Reminded him again that this was the elbow  and he states that hand specialist can handle the elbow.   Clinical Course as of Oct 27 2023  Thu Oct 27, 2018  [id number] Patient examined by me, does have pronounced swelling and ecchymosis around the right elbow, no signs or symptoms of compartment syndrome.  Advised by Dr. Martha ClanKrasinski of ortho to place in splint and have her follow-up outpatient.  Patient advises that she is left-handed and feels comfortable managing at home with this injury until she can follow-up in clinic.  Will obtain CT of the elbow at Ortho's request, reassess for signs of compartment syndrome.  If exam is stable and if pain is controlled patient can go home.   [PS]    Clinical Course User Index [PS] Sharman CheekStafford, Phillip, MD  ----------------------------------------- 7:32 PM on 10/27/2018 -----------------------------------------  Dr. Martha ClanKrasinski returns cough.  He states he had a CT of the elbow and a x-ray of the humerus due to the shoulder prosthesis.  He states they are planning on surgery but this would be with a hand surgeon and would not be at [facility] as she operates at a different hospital.  The patient was given the information.  At this time we are sending her for a x-ray and CT.  We have repeated the morphine 4 mg IV for pain.  May be considering her for admission for pain control.  ----------------------------------------- 8:23 PM on 10/27/2018 -----------------------------------------  Patient is still saying she does not want to be admitted.  [facility]  I sent a copy of the chart to Dr. Ernest PineHooten as a heads up in case she calls their office tomorrow.  She also has Dr. Samuel GermanyKrasinski's information is we have talked with him tonight.  She is given a prescription for Vicodin 5/325 #15 with no refill.  She was discharged in stable condition.   Girtha HakeSandra Kidd was evaluated in Emergency Department on 10/27/2018 for the symptoms described in the history of present illness. She  was evaluated in the context of the global COVID-19 pandemic, which necessitated consideration that the patient might be at risk for infection with the  SARS-CoV-2 virus that causes COVID-19. Institutional protocols and algorithms that pertain to the evaluation of patients at risk for COVID-19 are in a state of rapid change based on information released by regulatory bodies including the CDC and federal and state organizations. These policies and algorithms were followed during the patient's care in the ED.   As part of my medical decision making, I reviewed the following data within the electronic MEDICAL RECORD NUMBER Nursing notes reviewed and incorporated, Old chart reviewed, Radiograph reviewed see above, A consult was requested and obtained from this/these consultant(s) Orthopedics, Evaluated by EM attending Dr. Scotty Court, Notes from prior ED visits and [place] Controlled Substance Database  ____________________________________________   FINAL CLINICAL IMPRESSION(S) / ED DIAGNOSES  Final diagnoses:  Closed fracture of distal end of humerus, unspecified fracture morphology, initial encounter  Fall, initial encounter      NEW MEDICATIONS STARTED DURING THIS VISIT:  New Prescriptions   HYDROCODONE-ACETAMINOPHEN (NORCO/VICODIN) 5-325 MG TABLET    Take 1 tablet by mouth every 6 (six) hours as needed for moderate pain.     Note:  This document was prepared using Dragon voice recognition software and may include unintentional dictation errors.    Faythe Ghee, PA-C 10/27/18 2025    Sharman Cheek, MD 10/31/18 [phone]

## 2018-10-27 NOTE — ED Triage Notes (Signed)
Presents via EMS s/p fall  Having pain to right elbow with small abrasion noted  Positive swelling noted   Possible deformity   Also has small contusion to right temporal area  Denies any LOC

## 2018-10-27 NOTE — ED Notes (Signed)
Pt unable to sign for receiving discharge instructions due to injury.

## 2018-10-29 ENCOUNTER — Emergency Department
Admission: EM | Admit: 2018-10-29 | Discharge: 2018-10-29 | Disposition: A | Discharge status: Home / Self Care | Payer: Medicare Other | Attending: Emergency Medicine | Admitting: Emergency Medicine

## 2018-10-29 ENCOUNTER — Encounter: Payer: Self-pay | Admitting: *Deleted

## 2018-10-29 ENCOUNTER — Other Ambulatory Visit: Payer: Self-pay

## 2018-10-29 ENCOUNTER — Emergency Department: Payer: Medicare Other

## 2018-10-29 DIAGNOSIS — Z79899 Other long term (current) drug therapy: Secondary | ICD-10-CM | POA: Insufficient documentation

## 2018-10-29 DIAGNOSIS — E119 Type 2 diabetes mellitus without complications: Secondary | ICD-10-CM | POA: Diagnosis not present

## 2018-10-29 DIAGNOSIS — Z7901 Long term (current) use of anticoagulants: Secondary | ICD-10-CM | POA: Insufficient documentation

## 2018-10-29 DIAGNOSIS — I10 Essential (primary) hypertension: Secondary | ICD-10-CM | POA: Insufficient documentation

## 2018-10-29 DIAGNOSIS — W1839XA Other fall on same level, initial encounter: Secondary | ICD-10-CM | POA: Diagnosis not present

## 2018-10-29 DIAGNOSIS — J45909 Unspecified asthma, uncomplicated: Secondary | ICD-10-CM | POA: Diagnosis not present

## 2018-10-29 DIAGNOSIS — Y939 Activity, unspecified: Secondary | ICD-10-CM | POA: Insufficient documentation

## 2018-10-29 DIAGNOSIS — Z7984 Long term (current) use of oral hypoglycemic drugs: Secondary | ICD-10-CM | POA: Insufficient documentation

## 2018-10-29 DIAGNOSIS — Y92000 Kitchen of unspecified non-institutional (private) residence as  the place of occurrence of the external cause: Secondary | ICD-10-CM | POA: Diagnosis not present

## 2018-10-29 DIAGNOSIS — S8392XA Sprain of unspecified site of left knee, initial encounter: Secondary | ICD-10-CM | POA: Diagnosis not present

## 2018-10-29 DIAGNOSIS — Y999 Unspecified external cause status: Secondary | ICD-10-CM | POA: Insufficient documentation

## 2018-10-29 DIAGNOSIS — W19XXXA Unspecified fall, initial encounter: Secondary | ICD-10-CM

## 2018-10-29 DIAGNOSIS — S8992XA Unspecified injury of left lower leg, initial encounter: Secondary | ICD-10-CM | POA: Diagnosis present

## 2018-10-29 LAB — URINALYSIS, COMPLETE (UACMP) WITH MICROSCOPIC
Bilirubin Urine: NEGATIVE
Glucose, UA: NEGATIVE mg/dL
Hgb urine dipstick: NEGATIVE
Ketones, ur: NEGATIVE mg/dL
Leukocytes,Ua: NEGATIVE
Nitrite: NEGATIVE
Protein, ur: NEGATIVE mg/dL
Specific Gravity, Urine: 1.017 (ref 1.005–1.030)
pH: 5 (ref 5.0–8.0)

## 2018-10-29 MED ORDER — BACITRACIN-NEOMYCIN-POLYMYXIN 400-5-5000 EX OINT
TOPICAL_OINTMENT | Freq: Once | CUTANEOUS | Status: AC
  Administered 2018-10-29: 1 via TOPICAL
  Filled 2018-10-29: qty 1

## 2018-10-29 NOTE — ED Triage Notes (Signed)
Per EMS report, patient had a fall in her kitchen this morning and was on the floor for approximately one hour. Patient states her left knee buckled. Patient has a fracture to to right distal humerus from a fall and was seen on 10/27/2018. Patient has an abrasion to left knee and resolving bruising. Patient has a resolving bruise to left upper arm. Patient has a splint and sling to right arm. Patient is alert and oriented upon arrival to ED.

## 2018-10-29 NOTE — ED Notes (Signed)
Please see triage note for skin assessment. Right hand is cool to the touch, but color is normal, cap refill WNL, right radial pulse is strong. Patient denies numbness in right hand. Patient moves all other extremities easily.

## 2018-10-29 NOTE — Progress Notes (Signed)
[facility] ED CSW received phone call from [facility] regarding patient's discharge and family concerns with patient safely returning home with history of current falls. CSW noted patient has surgery on Thursday coming up. Additionally, patient has some physical therapy treatment. CSW discussed reaching out for possible home health PT if it is an option. CSW notes patient will likely need HH PT or SNF post surgery. CSW also provided education on HCPOA and legal guardianship process.

## 2018-10-29 NOTE — ED Notes (Signed)
Unable to obtain e-signature due to computer malfunctioning.

## 2018-10-29 NOTE — Discharge Instructions (Addendum)
Your exam is normal following your fall. You did want and XR of the knee at this time. Rest with the knee elevated and apply ice as needed. Take the prescription meds as prescribed. Follow-up with Orthopedics for the elbow and the knee, as discussed. Return to the ED as needed.

## 2018-10-29 NOTE — ED Provider Notes (Signed)
[facility] Emergency Department Provider Note ____________________________________________  Time seen: 1332  I have reviewed the triage vital signs and the nursing notes.  HISTORY  Chief Complaint  Fall  HPI Leslie Gould is a 68 y.o. female presents to the ED via EMS from home, following a mechanical fall.  Patient describes her her left knee gave out under her weight, causing her to fall in her kitchen this morning.  According to EMS report, the patient was on the floor for approximately 1 hour. Her neighbor called EMS. Patient was seen in the ED 2 days prior, and has a confirmed comminuted distal right elbow fracture that is currently in an OCL splint.  She reports only minimal pain to the right elbow.  Patient is alert and oriented and is here for evaluation of her injuries.  There is no reported head injury, loss of consciousness, vomiting, diarrhea, dizziness, or disorientation.   Past Medical History:  Diagnosis Date  . Allergy   . Anxiety   . Asthma   . Chronic fatigue   . Clotting disorder ([place])   . Depression   . Diabetes mellitus without complication ([place])   . Fibromyalgia   . GERD (gastroesophageal reflux disease)   . Hypertension     Patient Active Problem List   Diagnosis Date Noted  . History of recurrent deep vein thrombosis (DVT) 07/08/2017  . Allergic rhinitis 07/08/2017  . Hypertension   . GERD (gastroesophageal reflux disease)   . Fibromyalgia   . Diabetes mellitus without complication ([place])   . Depression   . Chronic fatigue   . Asthma   . Anxiety     Past Surgical History:  Procedure Laterality Date  . APPENDECTOMY    . AUGMENTATION MAMMAPLASTY Bilateral 1980  . CESAREAN SECTION    . TONSILLECTOMY    . TUBAL LIGATION      Prior to Admission medications   Medication Sig Start Date End Date Taking? Authorizing Provider  ATROVENT HFA 17 MCG/ACT inhaler  07/01/17   [provider]  azelastine (ASTELIN) 0.1 %  nasal spray  07/01/17   [provider]  benztropine (COGENTIN) 1 MG tablet Take 1 mg by mouth 2 (two) times daily.    [provider]  candesartan (ATACAND) 32 MG tablet TK 1 T PO QD 06/21/17   [provider]  clonazePAM (KLONOPIN) 1 MG tablet Take 1 mg by mouth at bedtime as needed for anxiety.    [provider]  cloNIDine (CATAPRES - DOSED IN MG/24 HR) 0.1 mg/24hr patch Place 0.1 mg onto the skin once a week.    [provider]  escitalopram (LEXAPRO) 20 MG tablet Take 1.5 tablets by mouth at bedtime. 06/26/17   [provider]  fluticasone (FLONASE) 50 MCG/ACT nasal spray Place into both nostrils daily.    [provider]  fluticasone (FLOVENT HFA) 110 MCG/ACT inhaler Inhale into the lungs 2 (two) times daily.    [provider]  fluticasone furoate-vilanterol (BREO ELLIPTA) 200-25 MCG/INH AEPB Inhale 1 puff into the lungs daily.    [provider]  gabapentin (NEURONTIN) 600 MG tablet Take 600 mg by mouth 3 (three) times daily.    [provider]  glycopyrrolate (ROBINUL) 1 MG tablet Take 1 mg by mouth 3 (three) times daily.    [provider]  HYDROcodone-acetaminophen (NORCO/VICODIN) 5-325 MG tablet Take 1 tablet by mouth every 6 (six) hours as needed for moderate pain. 10/27/18   Versie Starks,  PA-C  hydrOXYzine (VISTARIL) 50 MG capsule  07/05/17   [provider]  ibuprofen (ADVIL,MOTRIN) 200 MG tablet Take 400 mg by mouth every 8 (eight) hours as needed.    [provider]  lurasidone (LATUDA) 40 MG TABS tablet Take 160 mg by mouth daily with breakfast.    [provider]  metFORMIN (GLUCOPHAGE) 1000 MG tablet Take 1,000 mg by mouth 2 (two) times daily with a meal.    [provider]  mirtazapine (REMERON) 45 MG tablet Take 45 mg by mouth at bedtime.    [provider]  montelukast (SINGULAIR) 10 MG tablet Take 10 mg by mouth at bedtime.    [provider]  omeprazole (PRILOSEC) 10 MG capsule  06/29/17   [provider]  propranolol (INDERAL) 80 MG tablet Take 80 mg by mouth 2 (two) times daily.    [provider]  rivaroxaban (XARELTO) 20 MG TABS tablet Take 20 mg by mouth daily with supper.    [provider]  tiotropium (SPIRIVA) 18 MCG inhalation capsule Place 18 mcg into inhaler and inhale daily.    [provider]    Allergies Lithium  Family History  Problem Relation Age of Onset  . Anxiety disorder Mother   . Heart disease Father 12  . Anxiety disorder Daughter   . Breast cancer Paternal Grandmother 45  . Breast cancer Cousin   . Colon cancer Neg Hx   . Ovarian cancer Neg Hx     Social History Social History   Tobacco Use  . Smoking status: Never Smoker  . Smokeless tobacco: Never Used  Substance Use Topics  . Alcohol use: Never    Frequency: Never  . Drug use: Never    Review of Systems  Constitutional: Negative for fever. Eyes: Negative for visual changes. ENT: Negative for sore throat. Cardiovascular: Negative for chest pain. Respiratory: Negative for shortness of breath. Gastrointestinal: Negative for abdominal pain, vomiting and diarrhea. Genitourinary: Negative for dysuria. Musculoskeletal: Negative for back pain. Left knee pain Skin: Negative for rash. Neurological: Negative for headaches, focal weakness or numbness. ____________________________________________  PHYSICAL EXAM:  VITAL SIGNS: ED Triage Vitals  Enc Vitals Group     BP 10/29/18 1309 124/70     Pulse Rate 10/29/18 1309 82     Resp 10/29/18 1309 18     Temp 10/29/18 1309 97.7 F (36.5 C)     Temp Source 10/29/18 1309 Oral     SpO2 10/29/18 1309 99 %     Weight 10/29/18 1310 200 lb (90.7 kg)     Height 10/29/18 1310 5' (1.524 m)     Head Circumference --      Peak Flow --      Pain Score 10/29/18 1310 3     Pain Loc --      Pain Edu? --      Excl. in [place]? --     Constitutional:  Alert and oriented. Well appearing and in no distress. Head: Normocephalic and atraumatic. Eyes: Conjunctivae are normal. PERRL. Normal extraocular movements and fundi bilaterally.  Ears: Canals clear. TMs intact bilaterally. Nose: No congestion/rhinorrhea/epistaxis. Mouth/Throat: Mucous membranes are moist. Neck: Supple. No thyromegaly. Normal ROM without crepitus.  Cardiovascular: Normal rate, regular rhythm. Normal distal pulses. Respiratory: Normal respiratory effort. No wheezes/rales/rhonchi. Gastrointestinal: Soft and nontender. No distention. Musculoskeletal: right UE with OCL splint in place. Normal composite fist on the right. Left knee without effusion, edema, or deformity. Nontender with normal range of  motion in all extremities.  Neurologic:  Normal gross sensation. Normal speech and language. No gross focal neurologic deficits are appreciated. Skin:  Skin is warm, dry and intact. No rash noted. Psychiatric: Mood and affect are normal. Patient exhibits appropriate insight and judgment. ____________________________________________   LABS (pertinent positives/negatives)  Labs Reviewed  URINALYSIS, COMPLETE (UACMP) WITH MICROSCOPIC - Abnormal; Notable for the following components:      Result Value   Color, Urine YELLOW (*)    APPearance HAZY (*)    Bacteria, UA RARE (*)    All other components within normal limits  ____________________________________________   RADIOLOGY  Patient Declined  ____________________________________________  PROCEDURES  Neosporin Dressing Procedures ____________________________________________  INITIAL IMPRESSION / ASSESSMENT AND PLAN / ED COURSE  Patient with ED evaluation of left knee pain following a mechanical fall.  Patient reports her knee gave out under weight, but denies any significant injury to the weight at the time of evaluation.  She declined an x-ray that was ordered for the left knee.  She denies any other injury at this  time.  Patient denies any reinjury to her previously fractured and splinted right elbow.  Patient is encouraged to follow-up with orthopedics as previously referred.  Should be discharged to follow-up as directed take medications as previously prescribed return precautions have been reviewed.  ----------------------------------------- 3:26 PM on 10/29/2018 ----------------------------------------- S/w adult son of patient, Mellody DrownJesse Nordan [phone]), in the presence of his mother. He voices concerns over several reported falls in the home. He lives in [place], and is only made aware of intermittent falls, as the patient usually calls the neighbor in a crisis. He is asking if she can be admitted ahead of her ortho surgery scheduled for 11/01/18. He is also reporting a decline in her cognition and expression, he believes due to her psyche meds. He is concerned for her ability to continue to live independently. When asked if he had voiced concerns to her PCP or psychiatrists, he describes his mother as "stubbonr" and admits to being less involved in her care as of late.   ----------------------------------------- 3:39 PM on 10/29/2018 ----------------------------------------- S/w with on-call Social Worker, Elvera LennoxJoel McKinley (sp). He suggests contact with local SW on Monday. He suggests evaluation for home health, OT/PT in anticipation of ortho surgery. He also suggests that is the son or other family are concerned about mental capacity, he should consider obtaining PoA or guardianship.   Girtha HakeSandra Pitre was evaluated in Emergency Department on 10/29/2018 for the symptoms described in the history of present illness. She was evaluated in the context of the global COVID-19 pandemic, which necessitated consideration that the patient might be at risk for infection with the SARS-CoV-2 virus that causes COVID-19. Institutional protocols and algorithms that pertain to the evaluation of patients at risk for COVID-19  are in a state of rapid change based on information released by regulatory bodies including the CDC and federal and state organizations. These policies and algorithms were followed during the patient's care in the ED. ____________________________________________  FINAL CLINICAL IMPRESSION(S) / ED DIAGNOSES  Final diagnoses:  Fall in home, initial encounter  Sprain of left knee, unspecified ligament, initial encounter      Lissa HoardMenshew, Icis Budreau V Bacon, PA-C 10/29/18 1553    Phineas SemenGoodman, [place], MD 10/30/18 [phone]

## 2018-10-30 ENCOUNTER — Other Ambulatory Visit: Payer: Self-pay

## 2018-10-30 ENCOUNTER — Emergency Department
Admission: EM | Admit: 2018-10-30 | Discharge: 2018-10-30 | Disposition: A | Discharge status: Home / Self Care | Payer: Medicare Other | Attending: Emergency Medicine | Admitting: Emergency Medicine

## 2018-10-30 ENCOUNTER — Encounter: Payer: Self-pay | Admitting: Emergency Medicine

## 2018-10-30 DIAGNOSIS — M79601 Pain in right arm: Secondary | ICD-10-CM | POA: Diagnosis present

## 2018-10-30 DIAGNOSIS — Z7901 Long term (current) use of anticoagulants: Secondary | ICD-10-CM | POA: Insufficient documentation

## 2018-10-30 DIAGNOSIS — Z7984 Long term (current) use of oral hypoglycemic drugs: Secondary | ICD-10-CM | POA: Diagnosis not present

## 2018-10-30 DIAGNOSIS — I10 Essential (primary) hypertension: Secondary | ICD-10-CM | POA: Insufficient documentation

## 2018-10-30 DIAGNOSIS — E119 Type 2 diabetes mellitus without complications: Secondary | ICD-10-CM | POA: Diagnosis not present

## 2018-10-30 DIAGNOSIS — J45909 Unspecified asthma, uncomplicated: Secondary | ICD-10-CM | POA: Insufficient documentation

## 2018-10-30 DIAGNOSIS — Z79899 Other long term (current) drug therapy: Secondary | ICD-10-CM | POA: Diagnosis not present

## 2018-10-30 DIAGNOSIS — Z4789 Encounter for other orthopedic aftercare: Secondary | ICD-10-CM | POA: Insufficient documentation

## 2018-10-30 NOTE — ED Triage Notes (Signed)
Pt c/o swelling to right arm where splint is applied. Splint was placed yesterday.

## 2018-10-30 NOTE — ED Provider Notes (Signed)
[facility] Emergency Department Provider Note ____________________________________________  Time seen: 1535  I have reviewed the triage vital signs and the nursing notes.  HISTORY  Chief Complaint  Arm Pain and Splint Check  HPI Leslie Gould is a 68 y.o. female returns to the ED following a closed comminuted fracture of the right humerus, with complaints of irritation from her OCL splint.  Patient was seen 4 days prior, on date of injury.  Mechanical fall resulted in her elbow fracture.  She was placed in a posterior OCL arm splint, and placed in a sling.  She presents today noting some tightness around the wrist as well as some abrasion to the skin from friction.  She also has a similar blister to the proximal part of the upper arm secondary to friction.  Patient is requesting replacement of her splint at this time.  No other injuries are reported.   Past Medical History:  Diagnosis Date  . Allergy   . Anxiety   . Asthma   . Chronic fatigue   . Clotting disorder (HCC)   . Depression   . Diabetes mellitus without complication (HCC)   . Fibromyalgia   . GERD (gastroesophageal reflux disease)   . Hypertension     Patient Active Problem List   Diagnosis Date Noted  . History of recurrent deep vein thrombosis (DVT) 07/08/2017  . Allergic rhinitis 07/08/2017  . Hypertension   . GERD (gastroesophageal reflux disease)   . Fibromyalgia   . Diabetes mellitus without complication (HCC)   . Depression   . Chronic fatigue   . Asthma   . Anxiety     Past Surgical History:  Procedure Laterality Date  . APPENDECTOMY    . AUGMENTATION MAMMAPLASTY Bilateral 1980  . CESAREAN SECTION    . TONSILLECTOMY    . TUBAL LIGATION      Prior to Admission medications   Medication Sig Start Date End Date Taking? Authorizing Provider  ATROVENT HFA 17 MCG/ACT inhaler  07/01/17   [provider]  azelastine (ASTELIN) 0.1 % nasal spray  07/01/17   [provider]  benztropine (COGENTIN) 1 MG tablet Take 1 mg by mouth 2 (two) times daily.    [provider]  candesartan (ATACAND) 32 MG tablet TK 1 T PO QD 06/21/17   [provider]  clonazePAM (KLONOPIN) 1 MG tablet Take 1 mg by mouth at bedtime as needed for anxiety.    [provider]  cloNIDine (CATAPRES - DOSED IN MG/24 HR) 0.1 mg/24hr patch Place 0.1 mg onto the skin once a week.    [provider]  escitalopram (LEXAPRO) 20 MG tablet Take 1.5 tablets by mouth at bedtime. 06/26/17   [provider]  fluticasone (FLONASE) 50 MCG/ACT nasal spray Place into both nostrils daily.    [provider]  fluticasone (FLOVENT HFA) 110 MCG/ACT inhaler Inhale into the lungs 2 (two) times daily.    [provider]  fluticasone furoate-vilanterol (BREO ELLIPTA) 200-25 MCG/INH AEPB Inhale 1 puff into the lungs daily.    [provider]  gabapentin (NEURONTIN) 600 MG tablet Take 600 mg by mouth 3 (three) times daily.    [provider]  glycopyrrolate (ROBINUL) 1 MG tablet Take 1 mg by mouth 3 (three) times daily.    [provider]  HYDROcodone-acetaminophen (NORCO/VICODIN) 5-325 MG tablet Take 1 tablet by mouth every 6 (six) hours as needed for moderate pain. 10/27/18   Faythe GheeFisher, Susan W, PA-C  hydrOXYzine (VISTARIL) 50 MG capsule  07/05/17   [provider]  ibuprofen (ADVIL,MOTRIN) 200 MG tablet Take 400 mg by mouth every 8 (eight) hours as needed.    [provider]  lurasidone (LATUDA) 40 MG TABS tablet Take 160 mg by mouth daily with breakfast.    [provider]  metFORMIN (GLUCOPHAGE) 1000 MG tablet Take 1,000 mg by mouth 2 (two) times daily with a meal.    [provider]  mirtazapine (REMERON) 45 MG tablet Take 45 mg by mouth at bedtime.    [provider]  montelukast (SINGULAIR) 10 MG tablet Take 10 mg by mouth at bedtime.    [provider]  omeprazole  (PRILOSEC) 10 MG capsule  06/29/17   [provider]  propranolol (INDERAL) 80 MG tablet Take 80 mg by mouth 2 (two) times daily.    [provider]  rivaroxaban (XARELTO) 20 MG TABS tablet Take 20 mg by mouth daily with supper.    [provider]  tiotropium (SPIRIVA) 18 MCG inhalation capsule Place 18 mcg into inhaler and inhale daily.    [provider]    Allergies Lithium  Family History  Problem Relation Age of Onset  . Anxiety disorder Mother   . Heart disease Father 27  . Anxiety disorder Daughter   . Breast cancer Paternal Grandmother 85  . Breast cancer Cousin   . Colon cancer Neg Hx   . Ovarian cancer Neg Hx     Social History Social History   Tobacco Use  . Smoking status: Never Smoker  . Smokeless tobacco: Never Used  Substance Use Topics  . Alcohol use: Never    Frequency: Never  . Drug use: Never    Review of Systems  Constitutional: Negative for fever. Cardiovascular: Negative for chest pain. Respiratory: Negative for shortness of breath. Musculoskeletal: Negative for back pain. Skin: Negative for rash. Abrasion from splint Neurological: Negative for headaches, focal weakness or numbness. ____________________________________________  PHYSICAL EXAM:  VITAL SIGNS: ED Triage Vitals  Enc Vitals Group     BP 10/30/18 1449 119/66     Pulse Rate 10/30/18 1449 73     Resp 10/30/18 1449 16     Temp 10/30/18 1449 98.4 F (36.9 C)     Temp Source 10/30/18 1449 Oral     SpO2 10/30/18 1449 95 %     Weight 10/30/18 1455 200 lb (90.7 kg)     Height 10/30/18 1455 5' (1.524 m)     Head Circumference --      Peak Flow --      Pain Score 10/30/18 1455 6     Pain Loc --      Pain Edu? --      Excl. in GC? --     Constitutional: Alert and oriented. Well appearing and in no distress. Head: Normocephalic and atraumatic. Eyes: Conjunctivae are normal. Normal extraocular movements Neck: Supple. Normal ROM Cardiovascular:  Normal rate, regular rhythm. Normal distal pulses and cap refill Respiratory: Normal respiratory effort.  Musculoskeletal: Normal composite fist on the right upper extremity.  Patient with some mild dependent edema appreciated.  The right UE splint is removed to reveal a posterior elbow abrasion from the initial injury.  The area is cleansed and petroleum gauze as well as Coban is applied to the abraded skin before the splint is reapplied.  Nontender with normal range of motion in all extremities.  Neurologic:  Normal gait without ataxia. Normal speech and language.  No gross focal neurologic deficits are appreciated. Skin:  Skin is warm, dry and intact. No rash noted.  Patient with some volar abrasion to the wrist consistent with friction rub from her previous splint.  She also has a nonintact abrasion to the proximal posterior upper arm consistent with contact with either her splint or the sling. Psychiatric: Mood and affect are normal. Patient exhibits appropriate insight and judgment. ____________________________________________  PROCEDURES  .Splint Application  Date/Time: 10/30/2018 3:45 PM Performed by: Melvenia Needles, PA-C Authorized by: Melvenia Needles, PA-C   Consent:    Consent obtained:  Verbal   Consent given by:  Patient   Risks discussed:  Pain Pre-procedure details:    Sensation:  Normal Procedure details:    Laterality:  Right   Location:  Elbow   Elbow:  R elbow   Strapping: no     Splint type:  Long arm   Supplies:  Cotton padding, elastic bandage and Ortho-Glass Post-procedure details:    Pain:  Improved   Sensation:  Normal   Patient tolerance of procedure:  Tolerated well, no immediate complications  ____________________________________________  INITIAL IMPRESSION / ASSESSMENT AND PLAN / ED COURSE  Patient with ED evaluation concern over irritation from her previously placed OCL splint.  Patient did have areas of irritation related to the  splint at the wrist and upper arm.  Those areas were cleansed and dressed separately.  The posterior long-arm OCL splint was replaced with padding beyond the length of the Ortho-Glass.  Patient reports this point feels more comfortable at this time.  She is discharged to follow-up with orthopedics as planned for surgical intervention.  Return precautions have been reviewed.  Leslie Gould was evaluated in Emergency Department on 10/30/2018 for the symptoms described in the history of present illness. She was evaluated in the context of the global COVID-19 pandemic, which necessitated consideration that the patient might be at risk for infection with the SARS-CoV-2 virus that causes COVID-19. Institutional protocols and algorithms that pertain to the evaluation of patients at risk for COVID-19 are in a state of rapid change based on information released by regulatory bodies including the CDC and federal and state organizations. These policies and algorithms were followed during the patient's care in the ED. ____________________________________________  FINAL CLINICAL IMPRESSION(S) / ED DIAGNOSES  Final diagnoses:  Cast discomfort      Carmie End, Dannielle Karvonen, PA-C 10/30/18 1816    Blake Divine, MD 10/31/18 0020

## 2018-10-30 NOTE — Discharge Instructions (Addendum)
You have had your splint replaced, today. Continue to wear the splint and sling at all time. Follow-up with Dr. Peggye Ley on Tuesday, November 01, 2018.   Call Ronnie at [facility] at [phone] to confirm your appointment time.

## 2018-10-30 NOTE — ED Notes (Signed)
Pt came today because had some swelling at wrist from splint. Splint was replaced by jenise PA.  Feels much better at this time. Right hand warm and dry.  Aware of splint precautions.

## 2018-11-29 ENCOUNTER — Other Ambulatory Visit: Payer: Self-pay | Admitting: Family Medicine

## 2018-11-29 ENCOUNTER — Ambulatory Visit
Admission: RE | Admit: 2018-11-29 | Discharge: 2018-11-29 | Disposition: A | Discharge status: Home / Self Care | Payer: Medicare Other | Source: Ambulatory Visit | Attending: Family Medicine | Admitting: Family Medicine

## 2018-11-29 DIAGNOSIS — Z1231 Encounter for screening mammogram for malignant neoplasm of breast: Secondary | ICD-10-CM

## 2019-07-18 ENCOUNTER — Emergency Department: Payer: Medicare Other

## 2019-07-18 ENCOUNTER — Inpatient Hospital Stay
Admission: EM | Admit: 2019-07-18 | Discharge: 2019-07-20 | DRG: 690 | Disposition: A | Discharge status: Home Health | Payer: Medicare Other | Attending: Internal Medicine | Admitting: Internal Medicine

## 2019-07-18 ENCOUNTER — Other Ambulatory Visit: Payer: Self-pay

## 2019-07-18 DIAGNOSIS — Z7984 Long term (current) use of oral hypoglycemic drugs: Secondary | ICD-10-CM | POA: Diagnosis not present

## 2019-07-18 DIAGNOSIS — Z7951 Long term (current) use of inhaled steroids: Secondary | ICD-10-CM

## 2019-07-18 DIAGNOSIS — Z888 Allergy status to other drugs, medicaments and biological substances status: Secondary | ICD-10-CM | POA: Diagnosis not present

## 2019-07-18 DIAGNOSIS — M797 Fibromyalgia: Secondary | ICD-10-CM | POA: Diagnosis present

## 2019-07-18 DIAGNOSIS — R296 Repeated falls: Secondary | ICD-10-CM

## 2019-07-18 DIAGNOSIS — R5382 Chronic fatigue, unspecified: Secondary | ICD-10-CM | POA: Diagnosis not present

## 2019-07-18 DIAGNOSIS — F329 Major depressive disorder, single episode, unspecified: Secondary | ICD-10-CM | POA: Diagnosis present

## 2019-07-18 DIAGNOSIS — R531 Weakness: Secondary | ICD-10-CM | POA: Diagnosis present

## 2019-07-18 DIAGNOSIS — N179 Acute kidney failure, unspecified: Secondary | ICD-10-CM | POA: Diagnosis present

## 2019-07-18 DIAGNOSIS — Z79899 Other long term (current) drug therapy: Secondary | ICD-10-CM

## 2019-07-18 DIAGNOSIS — E119 Type 2 diabetes mellitus without complications: Secondary | ICD-10-CM

## 2019-07-18 DIAGNOSIS — Z6839 Body mass index (BMI) 39.0-39.9, adult: Secondary | ICD-10-CM

## 2019-07-18 DIAGNOSIS — Z818 Family history of other mental and behavioral disorders: Secondary | ICD-10-CM | POA: Diagnosis not present

## 2019-07-18 DIAGNOSIS — Z9049 Acquired absence of other specified parts of digestive tract: Secondary | ICD-10-CM

## 2019-07-18 DIAGNOSIS — N390 Urinary tract infection, site not specified: Secondary | ICD-10-CM | POA: Diagnosis not present

## 2019-07-18 DIAGNOSIS — E66813 Obesity, class 3: Secondary | ICD-10-CM | POA: Diagnosis present

## 2019-07-18 DIAGNOSIS — N3000 Acute cystitis without hematuria: Principal | ICD-10-CM

## 2019-07-18 DIAGNOSIS — Z86718 Personal history of other venous thrombosis and embolism: Secondary | ICD-10-CM | POA: Diagnosis not present

## 2019-07-18 DIAGNOSIS — I10 Essential (primary) hypertension: Secondary | ICD-10-CM | POA: Diagnosis present

## 2019-07-18 DIAGNOSIS — F411 Generalized anxiety disorder: Secondary | ICD-10-CM | POA: Diagnosis present

## 2019-07-18 DIAGNOSIS — R9082 White matter disease, unspecified: Secondary | ICD-10-CM | POA: Diagnosis present

## 2019-07-18 DIAGNOSIS — J449 Chronic obstructive pulmonary disease, unspecified: Secondary | ICD-10-CM | POA: Diagnosis present

## 2019-07-18 DIAGNOSIS — Z9851 Tubal ligation status: Secondary | ICD-10-CM | POA: Diagnosis not present

## 2019-07-18 DIAGNOSIS — R251 Tremor, unspecified: Secondary | ICD-10-CM | POA: Diagnosis present

## 2019-07-18 DIAGNOSIS — Z20822 Contact with and (suspected) exposure to covid-19: Secondary | ICD-10-CM | POA: Diagnosis present

## 2019-07-18 DIAGNOSIS — E860 Dehydration: Secondary | ICD-10-CM | POA: Diagnosis present

## 2019-07-18 DIAGNOSIS — K219 Gastro-esophageal reflux disease without esophagitis: Secondary | ICD-10-CM | POA: Diagnosis present

## 2019-07-18 DIAGNOSIS — F32A Depression, unspecified: Secondary | ICD-10-CM | POA: Diagnosis present

## 2019-07-18 DIAGNOSIS — F419 Anxiety disorder, unspecified: Secondary | ICD-10-CM | POA: Diagnosis not present

## 2019-07-18 LAB — CBC WITH DIFFERENTIAL/PLATELET
Abs Immature Granulocytes: 0.05 10*3/uL (ref 0.00–0.07)
Basophils Absolute: 0.1 10*3/uL (ref 0.0–0.1)
Basophils Relative: 1 %
Eosinophils Absolute: 0.8 10*3/uL — ABNORMAL HIGH (ref 0.0–0.5)
Eosinophils Relative: 9 %
HCT: 38.1 % (ref 36.0–46.0)
Hemoglobin: 12.5 g/dL (ref 12.0–15.0)
Immature Granulocytes: 1 %
Lymphocytes Relative: 28 %
Lymphs Abs: 2.6 10*3/uL (ref 0.7–4.0)
MCH: 27.7 pg (ref 26.0–34.0)
MCHC: 32.8 g/dL (ref 30.0–36.0)
MCV: 84.5 fL (ref 80.0–100.0)
Monocytes Absolute: 1 10*3/uL (ref 0.1–1.0)
Monocytes Relative: 11 %
Neutro Abs: 4.8 10*3/uL (ref 1.7–7.7)
Neutrophils Relative %: 50 %
Platelets: 386 10*3/uL (ref 150–400)
RBC: 4.51 MIL/uL (ref 3.87–5.11)
RDW: 15.3 % (ref 11.5–15.5)
WBC: 9.4 10*3/uL (ref 4.0–10.5)
nRBC: 0 % (ref 0.0–0.2)

## 2019-07-18 LAB — COMPREHENSIVE METABOLIC PANEL
ALT: 27 U/L (ref 0–44)
AST: 28 U/L (ref 15–41)
Albumin: 4.2 g/dL (ref 3.5–5.0)
Alkaline Phosphatase: 76 U/L (ref 38–126)
Anion gap: 14 (ref 5–15)
BUN: 29 mg/dL — ABNORMAL HIGH (ref 8–23)
CO2: 27 mmol/L (ref 22–32)
Calcium: 9.8 mg/dL (ref 8.9–10.3)
Chloride: 97 mmol/L — ABNORMAL LOW (ref 98–111)
Creatinine, Ser: 1.43 mg/dL — ABNORMAL HIGH (ref 0.44–1.00)
GFR calc Af Amer: 43 mL/min — ABNORMAL LOW (ref >60)
GFR calc non Af Amer: 37 mL/min — ABNORMAL LOW (ref >60)
Glucose, Bld: 96 mg/dL (ref 70–99)
Potassium: 4.8 mmol/L (ref 3.5–5.1)
Sodium: 138 mmol/L (ref 135–145)
Total Bilirubin: 0.8 mg/dL (ref 0.3–1.2)
Total Protein: 7.1 g/dL (ref 6.5–8.1)

## 2019-07-18 LAB — URINALYSIS, COMPLETE (UACMP) WITH MICROSCOPIC
Bilirubin Urine: NEGATIVE
Glucose, UA: NEGATIVE mg/dL
Hgb urine dipstick: NEGATIVE
Ketones, ur: NEGATIVE mg/dL
Nitrite: NEGATIVE
Protein, ur: NEGATIVE mg/dL
Specific Gravity, Urine: 1.011 (ref 1.005–1.030)
pH: 7 (ref 5.0–8.0)

## 2019-07-18 LAB — SARS CORONAVIRUS 2 BY RT PCR (HOSPITAL ORDER, PERFORMED IN ~~LOC~~ HOSPITAL LAB): SARS Coronavirus 2: NEGATIVE

## 2019-07-18 LAB — GLUCOSE, CAPILLARY
Glucose-Capillary: 96 mg/dL (ref 70–99)
Glucose-Capillary: 103 mg/dL — ABNORMAL HIGH (ref 70–99)

## 2019-07-18 LAB — CKMB (ARMC ONLY): CK, MB: 4.4 ng/mL (ref 0.5–5.0)

## 2019-07-18 LAB — HIV ANTIBODY (ROUTINE TESTING W REFLEX): HIV Screen 4th Generation wRfx: NONREACTIVE

## 2019-07-18 MED ORDER — GLYCOPYRROLATE 1 MG PO TABS
1.0000 mg | ORAL_TABLET | Freq: Three times a day (TID) | ORAL | Status: DC
  Administered 2019-07-18 – 2019-07-20 (×5): 1 mg via ORAL
  Filled 2019-07-18 (×9): qty 1

## 2019-07-18 MED ORDER — ACETAMINOPHEN 325 MG PO TABS: 650.0000 mg | ORAL_TABLET | Freq: Four times a day (QID) | ORAL | Status: DC | PRN

## 2019-07-18 MED ORDER — PANTOPRAZOLE SODIUM 40 MG PO TBEC
40.0000 mg | DELAYED_RELEASE_TABLET | Freq: Every day | ORAL | Status: DC
  Administered 2019-07-18 – 2019-07-20 (×3): 40 mg via ORAL
  Filled 2019-07-18 (×3): qty 1

## 2019-07-18 MED ORDER — FLUTICASONE PROPIONATE 50 MCG/ACT NA SUSP
1.0000 | Freq: Every day | NASAL | Status: DC
  Administered 2019-07-19 – 2019-07-20 (×2): 1 via NASAL
  Filled 2019-07-18: qty 16

## 2019-07-18 MED ORDER — BENZTROPINE MESYLATE 1 MG PO TABS
1.0000 mg | ORAL_TABLET | Freq: Two times a day (BID) | ORAL | Status: DC
  Administered 2019-07-18 – 2019-07-20 (×4): 1 mg via ORAL
  Filled 2019-07-18 (×5): qty 1

## 2019-07-18 MED ORDER — GABAPENTIN 600 MG PO TABS
600.0000 mg | ORAL_TABLET | Freq: Three times a day (TID) | ORAL | Status: DC
  Administered 2019-07-18 – 2019-07-20 (×6): 600 mg via ORAL
  Filled 2019-07-18 (×6): qty 1

## 2019-07-18 MED ORDER — ONDANSETRON HCL 4 MG PO TABS: 4.0000 mg | ORAL_TABLET | Freq: Four times a day (QID) | ORAL | Status: DC | PRN

## 2019-07-18 MED ORDER — MIRTAZAPINE 15 MG PO TABS
45.0000 mg | ORAL_TABLET | Freq: Every day | ORAL | Status: DC
  Administered 2019-07-18: 21:00:00 45 mg via ORAL
  Filled 2019-07-18: qty 3

## 2019-07-18 MED ORDER — SODIUM CHLORIDE 0.9 % IV SOLN: Freq: Once | INTRAVENOUS | Status: DC

## 2019-07-18 MED ORDER — BENZTROPINE MESYLATE 1 MG PO TABS
1.0000 mg | ORAL_TABLET | Freq: Every day | ORAL | Status: DC
  Administered 2019-07-18: 1 mg via ORAL
  Filled 2019-07-18: qty 1

## 2019-07-18 MED ORDER — LURASIDONE HCL 80 MG PO TABS
160.0000 mg | ORAL_TABLET | Freq: Every day | ORAL | Status: DC
  Administered 2019-07-19: 160 mg via ORAL
  Filled 2019-07-18: qty 2

## 2019-07-18 MED ORDER — HYDROCODONE-ACETAMINOPHEN 5-325 MG PO TABS: 1.0000 | ORAL_TABLET | Freq: Four times a day (QID) | ORAL | Status: DC | PRN

## 2019-07-18 MED ORDER — HYDROXYZINE HCL 50 MG PO TABS
50.0000 mg | ORAL_TABLET | Freq: Every day | ORAL | Status: DC
  Administered 2019-07-19 – 2019-07-20 (×2): 50 mg via ORAL
  Filled 2019-07-18 (×2): qty 1

## 2019-07-18 MED ORDER — ESCITALOPRAM OXALATE 10 MG PO TABS
30.0000 mg | ORAL_TABLET | Freq: Every day | ORAL | Status: DC
  Administered 2019-07-18: 30 mg via ORAL
  Filled 2019-07-18 (×2): qty 3

## 2019-07-18 MED ORDER — RIVAROXABAN 20 MG PO TABS
20.0000 mg | ORAL_TABLET | Freq: Every day | ORAL | Status: DC
  Administered 2019-07-19: 20 mg via ORAL
  Filled 2019-07-18 (×3): qty 1

## 2019-07-18 MED ORDER — AZELASTINE HCL 0.1 % NA SOLN: 1.0000 | Freq: Two times a day (BID) | NASAL | Status: DC

## 2019-07-18 MED ORDER — CLONIDINE HCL 0.1 MG/24HR TD PTWK
0.1000 mg | MEDICATED_PATCH | TRANSDERMAL | Status: DC
  Filled 2019-07-18: qty 1

## 2019-07-18 MED ORDER — PROPRANOLOL HCL 40 MG PO TABS
80.0000 mg | ORAL_TABLET | Freq: Two times a day (BID) | ORAL | Status: DC
  Administered 2019-07-18 – 2019-07-20 (×4): 80 mg via ORAL
  Filled 2019-07-18 (×5): qty 2

## 2019-07-18 MED ORDER — SODIUM CHLORIDE 0.9% FLUSH
3.0000 mL | Freq: Two times a day (BID) | INTRAVENOUS | Status: DC
  Administered 2019-07-18 – 2019-07-20 (×5): 3 mL via INTRAVENOUS

## 2019-07-18 MED ORDER — CLONAZEPAM 0.5 MG PO TABS
1.0000 mg | ORAL_TABLET | Freq: Once | ORAL | Status: AC
  Administered 2019-07-18: 1 mg via ORAL
  Filled 2019-07-18: qty 2

## 2019-07-18 MED ORDER — ACETAMINOPHEN 650 MG RE SUPP: 650.0000 mg | Freq: Four times a day (QID) | RECTAL | Status: DC | PRN

## 2019-07-18 MED ORDER — SODIUM CHLORIDE 0.9 % IV SOLN: INTRAVENOUS | Status: DC

## 2019-07-18 MED ORDER — SODIUM CHLORIDE 0.9 % IV SOLN
1.0000 g | Freq: Once | INTRAVENOUS | Status: AC
  Administered 2019-07-18: 1 g via INTRAVENOUS
  Filled 2019-07-18: qty 10

## 2019-07-18 MED ORDER — TIOTROPIUM BROMIDE MONOHYDRATE 18 MCG IN CAPS
18.0000 ug | ORAL_CAPSULE | Freq: Every day | RESPIRATORY_TRACT | Status: DC
  Administered 2019-07-19 – 2019-07-20 (×2): 18 ug via RESPIRATORY_TRACT
  Filled 2019-07-18: qty 5

## 2019-07-18 MED ORDER — SODIUM CHLORIDE 0.9 % IV BOLUS
500.0000 mL | Freq: Once | INTRAVENOUS | Status: AC
  Administered 2019-07-18: 500 mL via INTRAVENOUS

## 2019-07-18 MED ORDER — CLONAZEPAM 1 MG PO TABS
1.0000 mg | ORAL_TABLET | Freq: Every evening | ORAL | Status: DC | PRN
  Administered 2019-07-18 – 2019-07-19 (×2): 1 mg via ORAL
  Filled 2019-07-18 (×2): qty 1

## 2019-07-18 MED ORDER — ONDANSETRON HCL 4 MG/2ML IJ SOLN: 4.0000 mg | Freq: Four times a day (QID) | INTRAMUSCULAR | Status: DC | PRN

## 2019-07-18 MED ORDER — FLUTICASONE FUROATE-VILANTEROL 200-25 MCG/INH IN AEPB
1.0000 | INHALATION_SPRAY | Freq: Every day | RESPIRATORY_TRACT | Status: DC
  Administered 2019-07-19 – 2019-07-20 (×2): 1 via RESPIRATORY_TRACT
  Filled 2019-07-18: qty 28

## 2019-07-18 MED ORDER — INSULIN ASPART 100 UNIT/ML ~~LOC~~ SOLN: 0.0000 [IU] | Freq: Three times a day (TID) | SUBCUTANEOUS | Status: DC

## 2019-07-18 MED ORDER — SODIUM CHLORIDE 0.9 % IV SOLN
1.0000 g | INTRAVENOUS | Status: DC
  Administered 2019-07-19 – 2019-07-20 (×2): 1 g via INTRAVENOUS
  Filled 2019-07-18 (×2): qty 1
  Filled 2019-07-18: qty 10

## 2019-07-18 MED ORDER — AZELASTINE HCL 0.1 % NA SOLN
1.0000 | Freq: Two times a day (BID) | NASAL | Status: DC
  Administered 2019-07-18 – 2019-07-20 (×4): 1 via NASAL
  Filled 2019-07-18: qty 30

## 2019-07-18 MED ORDER — MONTELUKAST SODIUM 10 MG PO TABS
10.0000 mg | ORAL_TABLET | Freq: Every day | ORAL | Status: DC
  Administered 2019-07-18 – 2019-07-19 (×2): 10 mg via ORAL
  Filled 2019-07-18 (×2): qty 1

## 2019-07-18 NOTE — ED Notes (Signed)
Report called to [facility] floor nurse

## 2019-07-18 NOTE — ED Triage Notes (Signed)
Pt arrived via EMS from home d/t increase in falls and tremors. Pt has recently stopped taking some of her medications d/t them "making her feel weird" per EMS.

## 2019-07-18 NOTE — ED Provider Notes (Signed)
[facility] Emergency Department Provider Note    First MD Initiated Contact with Patient 07/18/19 [phone]     (approximate)  I have reviewed the triage vital signs and the nursing notes.   HISTORY  Chief Complaint Fall    HPI Leslie Gould is a 69 y.o. female bolus past medical history presents to the ER for evaluation of frequent falls as well as intermittent tremor.  States that this was been a chronic problem.  States that she ran out of her Klonopin.  Denies any loss of consciousness.  Denies any chest pain.  No nausea or vomiting.  Does endorse generalized weakness and fatigue.  States that she been doing home PT but feels like she is having trouble caring for her cat at home.    Past Medical History:  Diagnosis Date  . Allergy   . Anxiety   . Asthma   . Chronic fatigue   . Clotting disorder (HCC)   . Depression   . Diabetes mellitus without complication (HCC)   . Fibromyalgia   . GERD (gastroesophageal reflux disease)   . Hypertension    Family History  Problem Relation Age of Onset  . Anxiety disorder Mother   . Heart disease Father 11  . Anxiety disorder Daughter   . Breast cancer Paternal Grandmother 75  . Breast cancer Cousin   . Colon cancer Neg Hx   . Ovarian cancer Neg Hx    Past Surgical History:  Procedure Laterality Date  . APPENDECTOMY    . AUGMENTATION MAMMAPLASTY Bilateral 1980  . CESAREAN SECTION    . TONSILLECTOMY    . TUBAL LIGATION     Patient Active Problem List   Diagnosis Date Noted  . History of recurrent deep vein thrombosis (DVT) 07/08/2017  . Allergic rhinitis 07/08/2017  . Hypertension   . GERD (gastroesophageal reflux disease)   . Fibromyalgia   . Diabetes mellitus without complication (HCC)   . Depression   . Chronic fatigue   . Asthma   . Anxiety       Prior to Admission medications   Medication Sig Start Date End Date Taking? Authorizing Provider  ATROVENT HFA 17 MCG/ACT inhaler  07/01/17    [provider]  azelastine (ASTELIN) 0.1 % nasal spray  07/01/17   [provider]  benztropine (COGENTIN) 1 MG tablet Take 1 mg by mouth 2 (two) times daily.    [provider]  candesartan (ATACAND) 32 MG tablet TK 1 T PO QD 06/21/17   [provider]  clonazePAM (KLONOPIN) 1 MG tablet Take 1 mg by mouth at bedtime as needed for anxiety.    [provider]  cloNIDine (CATAPRES - DOSED IN MG/24 HR) 0.1 mg/24hr patch Place 0.1 mg onto the skin once a week.    [provider]  escitalopram (LEXAPRO) 20 MG tablet Take 1.5 tablets by mouth at bedtime. 06/26/17   [provider]  fluticasone (FLONASE) 50 MCG/ACT nasal spray Place into both nostrils daily.    [provider]  fluticasone (FLOVENT HFA) 110 MCG/ACT inhaler Inhale into the lungs 2 (two) times daily.    [provider]  fluticasone furoate-vilanterol (BREO ELLIPTA) 200-25 MCG/INH AEPB Inhale 1 puff into the lungs daily.    [provider]  gabapentin (NEURONTIN) 600 MG tablet Take 600 mg by mouth 3 (three) times daily.    [provider]  glycopyrrolate (ROBINUL) 1 MG tablet Take 1 mg by mouth 3 (three)  times daily.    [provider]  HYDROcodone-acetaminophen (NORCO/VICODIN) 5-325 MG tablet Take 1 tablet by mouth every 6 (six) hours as needed for moderate pain. 10/27/18   [name], PA-C  hydrOXYzine (VISTARIL) 50 MG capsule  07/05/17   [provider]  ibuprofen (ADVIL,MOTRIN) 200 MG tablet Take 400 mg by mouth every 8 (eight) hours as needed.    [provider]  lurasidone (LATUDA) 40 MG TABS tablet Take 160 mg by mouth daily with breakfast.    [provider]  metFORMIN (GLUCOPHAGE) 1000 MG tablet Take 1,000 mg by mouth 2 (two) times daily with a meal.    [provider]  mirtazapine (REMERON) 45 MG tablet Take 45 mg by mouth at bedtime.    [provider]  montelukast (SINGULAIR) 10  MG tablet Take 10 mg by mouth at bedtime.    [provider]  omeprazole (PRILOSEC) 10 MG capsule  06/29/17   [provider]  propranolol (INDERAL) 80 MG tablet Take 80 mg by mouth 2 (two) times daily.    [provider]  rivaroxaban (XARELTO) 20 MG TABS tablet Take 20 mg by mouth daily with supper.    [provider]  tiotropium (SPIRIVA) 18 MCG inhalation capsule Place 18 mcg into inhaler and inhale daily.    [provider]    Allergies Lithium    Social History Social History   Tobacco Use  . Smoking status: Never Smoker  . Smokeless tobacco: Never Used  Vaping Use  . Vaping Use: Never used  Substance Use Topics  . Alcohol use: Never  . Drug use: Never    Review of Systems Patient denies headaches, rhinorrhea, blurry vision, numbness, shortness of breath, chest pain, edema, cough, abdominal pain, nausea, vomiting, diarrhea, dysuria, fevers, rashes or hallucinations unless otherwise stated above in HPI. ____________________________________________   PHYSICAL EXAM:  VITAL SIGNS: Vitals:   07/18/19 0913  BP: (!) 159/79  Pulse: 64  Resp: 18  Temp: 98.4 F (36.9 C)  SpO2: 96%    Constitutional: Alert and oriented.  Eyes: Conjunctivae are normal.  Head: Atraumatic. Nose: No congestion/rhinnorhea. Mouth/Throat: Mucous membranes are moist.   Neck: No stridor. Painless ROM.  Cardiovascular: Normal rate, regular rhythm. Grossly normal heart sounds.  Good peripheral circulation. Respiratory: Normal respiratory effort.  No retractions. Lungs CTAB. Gastrointestinal: Soft and nontender. No distention. No abdominal bruits. No CVA tenderness. Genitourinary:  Musculoskeletal: No lower extremity tenderness nor edema.  No joint effusions. Neurologic:  Normal speech and language. BU and BLE intention tremor affecting gait and UE use.  No facial droop.  No latrealizing weakness Skin:  Skin is warm, dry and intact. No rash  noted. Psychiatric: Mood and affect are normal. Speech and behavior are normal.  ____________________________________________   LABS (all labs ordered are listed, but only abnormal results are displayed)  Results for orders placed or performed during the hospital encounter of 07/18/19 (from the past 24 hour(s))  CBC with Differential/Platelet     Status: Abnormal   Collection Time: 07/18/19 10:00 AM  Result Value Ref Range   WBC 9.4 4.0 - 10.5 K/uL   RBC 4.51 3.87 - 5.11 MIL/uL   Hemoglobin 12.5 12.0 - 15.0 g/dL   HCT 70.2 36 - 46 %   MCV 84.5 80.0 - 100.0 fL   MCH 27.7 26.0 - 34.0 pg   MCHC 32.8 30.0 - 36.0 g/dL   RDW 63.7 85.8 - 85.0 %   Platelets 386 150 -  400 K/uL   nRBC 0.0 0.0 - 0.2 %   Neutrophils Relative % 50 %   Neutro Abs 4.8 1.7 - 7.7 K/uL   Lymphocytes Relative 28 %   Lymphs Abs 2.6 0.7 - 4.0 K/uL   Monocytes Relative 11 %   Monocytes Absolute 1.0 0 - 1 K/uL   Eosinophils Relative 9 %   Eosinophils Absolute 0.8 (H) 0 - 0 K/uL   Basophils Relative 1 %   Basophils Absolute 0.1 0 - 0 K/uL   Immature Granulocytes 1 %   Abs Immature Granulocytes 0.05 0.00 - 0.07 K/uL  Comprehensive metabolic panel     Status: Abnormal   Collection Time: 07/18/19 10:00 AM  Result Value Ref Range   Sodium 138 135 - 145 mmol/L   Potassium 4.8 3.5 - 5.1 mmol/L   Chloride 97 (L) 98 - 111 mmol/L   CO2 27 22 - 32 mmol/L   Glucose, Bld 96 70 - 99 mg/dL   BUN 29 (H) 8 - 23 mg/dL   Creatinine, Ser 1.611.43 (H) 0.44 - 1.00 mg/dL   Calcium 9.8 8.9 - 09.610.3 mg/dL   Total Protein 7.1 6.5 - 8.1 g/dL   Albumin 4.2 3.5 - 5.0 g/dL   AST 28 15 - 41 U/L   ALT 27 0 - 44 U/L   Alkaline Phosphatase 76 38 - 126 U/L   Total Bilirubin 0.8 0.3 - 1.2 mg/dL   GFR calc non Af Amer 37 (L) >60 mL/min   GFR calc Af Amer 43 (L) >60 mL/min   Anion gap 14 5 - 15  Urinalysis, Complete w Microscopic     Status: Abnormal   Collection Time: 07/18/19 10:00 AM  Result Value Ref Range   Color, Urine YELLOW (A) YELLOW    APPearance CLOUDY (A) CLEAR   Specific Gravity, Urine 1.011 1.005 - 1.030   pH 7.0 5.0 - 8.0   Glucose, UA NEGATIVE NEGATIVE mg/dL   Hgb urine dipstick NEGATIVE NEGATIVE   Bilirubin Urine NEGATIVE NEGATIVE   Ketones, ur NEGATIVE NEGATIVE mg/dL   Protein, ur NEGATIVE NEGATIVE mg/dL   Nitrite NEGATIVE NEGATIVE   Leukocytes,Ua MODERATE (A) NEGATIVE   RBC / HPF 0-5 0 - 5 RBC/hpf   WBC, UA 21-50 0 - 5 WBC/hpf   Bacteria, UA RARE (A) NONE SEEN   Squamous Epithelial / LPF 0-5 0 - 5   Mucus PRESENT    Amorphous Crystal PRESENT    ____________________________________________ ____________________________________________  RADIOLOGY  I personally reviewed all radiographic images ordered to evaluate for the above acute complaints and reviewed radiology reports and findings.  These findings were personally discussed with the patient.  Please see medical record for radiology report.  ____________________________________________   PROCEDURES  Procedure(s) performed:  Procedures    Critical Care performed: no ____________________________________________   INITIAL IMPRESSION / ASSESSMENT AND PLAN / ED COURSE  Pertinent labs & imaging results that were available during my care of the patient were reviewed by me and considered in my medical decision making (see chart for details).   DDX: dehydration, electrolyte abn, cva, ich, uti, medicatino effec  Leslie Gould is a 69 y.o. who presents to the ED with symptoms as described above.  Patient with diffuse intention tremor.  Does not appear consistent with seizures.  On review of records it seems that this is chronic but feels like this is much more severe and is causing her to have more frequent falls.  Also endorsing dysuria this morning.  May  be metabolic secondary to UTI or dehydration.  Blood work will be ordered.  Will order imaging.  The patient will be placed on continuous pulse oximetry and telemetry for monitoring.  Laboratory  evaluation will be sent to evaluate for the above complaints.     Clinical Course as of Jul 17 1328  Tue Jul 18, 2019  1058 Patient feels improved after Cogentin.  Blood work otherwise reassuring several mild dehydration.  Will give IV fluids.  States she has been having some urinary discomfort therefore will treat for UTI.  States that she has not been able to take her Cogentin because her insurance stopped paying for.  Patient having difficulty with ambulation and requiring two-person assist.  Given her weakness with evidence of AKI and cystitis living at home alone will discuss case with hospitalist for admission for IV fluids IV antibiotics as well as social work and PT evaluation.   [PR]    Clinical Course User Index [PR] Willy Eddy, MD    The patient was evaluated in Emergency Department today for the symptoms described in the history of present illness. He/she was evaluated in the context of the global COVID-19 pandemic, which necessitated consideration that the patient might be at risk for infection with the SARS-CoV-2 virus that causes COVID-19. Institutional protocols and algorithms that pertain to the evaluation of patients at risk for COVID-19 are in a state of rapid change based on information released by regulatory bodies including the CDC and federal and state organizations. These policies and algorithms were followed during the patient's care in the ED.  As part of my medical decision making, I reviewed the following data within the electronic MEDICAL RECORD NUMBER Nursing notes reviewed and incorporated, Labs reviewed, notes from prior ED visits and  Controlled Substance Database   ____________________________________________   FINAL CLINICAL IMPRESSION(S) / ED DIAGNOSES  Final diagnoses:  Weakness  Frequent falls  AKI (acute kidney injury) (HCC)  Acute cystitis without hematuria      NEW MEDICATIONS STARTED DURING THIS VISIT:  New Prescriptions   No medications  on file     Note:  This document was prepared using Dragon voice recognition software and may include unintentional dictation errors.    Willy Eddy, MD 07/18/19 1330

## 2019-07-18 NOTE — TOC Initial Note (Addendum)
Transition of Care [facility]) - Initial/Assessment Note    Patient Details  Name: Leslie Gould MRN: [id number] Date of Birth: Oct 04, 1950  Transition of Care [facility]) CM/SW Contact:    Berry Hill Cellar, RN Phone Number: 07/18/2019, 3:30 PM  Clinical Narrative:                 Spoke with patient at bedside and patient has many concerns related to her psych medications. States she has an appointment on Thursday of this week at [facility] with her NP-Laura Dewdle to discuss potential side effects she feels are causing her leg weakness. Patient states she will become effective with Elton Sin on Thursday. Patient has extensive psychiatric history with insomnia and generalized anxiety disorder in which she has managed for years. Has daughter and son who are involved as much as possible but live in [place]. Patient was adamant against a walker or home health services insisting she really needed someone to help her do chores around the house including the garbage. States her home is not big enough for equipment. RN CM discussed in detail the option of potential SNF placement for therapy to strengthen her legs and work on balance. Patient states she has no problems getting her medication or getting to and from appointments and confirms she is compliant with her medications. Patient uses transportation services via DSS and [employer] to get to pharmacy or grocery store. Patient did confirm she had Medicaid and would like to get some PCS services if possible. Patient unclear on what she wants at discharge and states she will just have to see how strong she is feeling at discharge.   Expected Discharge Plan: Home/Self Care Barriers to Discharge: No Barriers Identified   Patient Goals and CMS Choice Patient states their goals for this hospitalization and ongoing recovery are:: Patient wants PCS services to help her at home      Expected Discharge Plan and Services Expected Discharge Plan: Home/Self  Care       Living arrangements for the past 2 months: Single Family Home                                      Prior Living Arrangements/Services Living arrangements for the past 2 months: Single Family Home Lives with:: Self (has pet cat) Patient language and need for interpreter reviewed:: Yes Do you feel safe going back to the place where you live?: Yes      Need for Family Participation in Patient Care: Yes (Comment) Care giver support system in place?: Yes (comment)   Criminal Activity/Legal Involvement Pertinent to Current Situation/Hospitalization: No - Comment as needed  Activities of Daily Living      Permission Sought/Granted Permission sought to share information with : Case Manager Permission granted to share information with : Yes, Verbal Permission Granted  Share Information with NAME: TOC Department           Emotional Assessment Appearance:: Appears stated age Attitude/Demeanor/Rapport: Engaged, Ambitious Affect (typically observed): Accepting Orientation: : Oriented to Self, Oriented to Place, Oriented to  Time, Oriented to Situation Alcohol / Substance Use: Never Used Psych Involvement: No (comment)  Admission diagnosis:  Frequent falls [R29.6] Patient Active Problem List   Diagnosis Date Noted  . AKI (acute kidney injury) (HCC) 07/18/2019  . Acute lower UTI 07/18/2019  . Frequent falls 07/18/2019  . Obesity, Class III, BMI 40-49.9 (morbid obesity) (HCC) 07/18/2019  .  History of recurrent deep vein thrombosis (DVT) 07/08/2017  . Allergic rhinitis 07/08/2017  . Hypertension   . GERD (gastroesophageal reflux disease)   . Fibromyalgia   . Diabetes mellitus without complication (HCC)   . Depression   . Chronic fatigue   . Asthma   . Anxiety    PCP:  Martie Round, NP Pharmacy:   [facility] [address], [place] - [address] [place] [address] Phone: [phone] Fax: [phone]  [facility] [phone] -  [place], [place] - [address] [place] [address] Phone: [phone] Fax: [phone]     Social Determinants of Health (SDOH) Interventions    Readmission Risk Interventions No flowsheet data found.

## 2019-07-18 NOTE — H&P (Addendum)
History and Physical    Leslie Gould XLK:[id number] DOB: 12/06/1950 DOA: 07/18/2019  PCP: Martie Round, NP   Patient coming from: Home  I have personally briefly reviewed patient's old medical records in [facility]  Chief Complaint: Frequent falls  HPI: Leslie Gould is a 69 y.o. female with medical history significant for depression, anxiety, GERD, fibromyalgia, diabetes mellitus and hypertension who presents to the emergency room via EMS for evaluation of frequent falls and tremors.  Patient takes benzodiazepines chronically and ran out of her medications.  She denies having any loss of consciousness and denies hitting her head on the fall.  She complains of generalized weakness and fatigue but denies having any nausea, vomiting, changes in her bowel habits, fever, chills, chest pain, shortness of breath. She complains of frequency of urination and dysuria. Labs reveal a serum creatinine of 1.43 above her baseline of 0.85. CT scan of the head without contrast shows mild diffuse cortical atrophy. Mild chronic ischemic white matter disease. No acute intracranial abnormality seen.  ED Course: Patient is a 69 year old female who presents to the emergency room for evaluation of increased frequency of falls as well as worsening tremors.  Patient is on chronic benzodiazepine therapy and states that she ran out of her medications.  Labs reveal worsening renal function from her baseline 0.85 >> 1.43 and she also has pyuria.  She will be admitted to the hospital for further evaluation  Review of Systems: As per HPI otherwise 10 point review of systems negative.    Past Medical History:  Diagnosis Date  . Allergy   . Anxiety   . Asthma   . Chronic fatigue   . Clotting disorder (HCC)   . Depression   . Diabetes mellitus without complication (HCC)   . Fibromyalgia   . GERD (gastroesophageal reflux disease)   . Hypertension     Past Surgical History:  Procedure Laterality Date    . APPENDECTOMY    . AUGMENTATION MAMMAPLASTY Bilateral 1980  . CESAREAN SECTION    . TONSILLECTOMY    . TUBAL LIGATION       reports that she has never smoked. She has never used smokeless tobacco. She reports that she does not drink alcohol and does not use drugs.  Allergies  Allergen Reactions  . Lithium Other (See Comments)    Tremors, nausea Tremors, nausea    Family History  Problem Relation Age of Onset  . Anxiety disorder Mother   . Heart disease Father 100  . Anxiety disorder Daughter   . Breast cancer Paternal Grandmother 37  . Breast cancer Cousin   . Colon cancer Neg Hx   . Ovarian cancer Neg Hx      Prior to Admission medications   Medication Sig Start Date End Date Taking? Authorizing Provider  ATROVENT HFA 17 MCG/ACT inhaler  07/01/17   [provider]  azelastine (ASTELIN) 0.1 % nasal spray  07/01/17   [provider]  benztropine (COGENTIN) 1 MG tablet Take 1 mg by mouth 2 (two) times daily.    [provider]  candesartan (ATACAND) 32 MG tablet TK 1 T PO QD 06/21/17   [provider]  clonazePAM (KLONOPIN) 1 MG tablet Take 1 mg by mouth at bedtime as needed for anxiety.    [provider]  cloNIDine (CATAPRES - DOSED IN MG/24 HR) 0.1 mg/24hr patch Place 0.1 mg onto the skin once a week.    [provider]  escitalopram (LEXAPRO) 20 MG  tablet Take 1.5 tablets by mouth at bedtime. 06/26/17   [provider]  fluticasone (FLONASE) 50 MCG/ACT nasal spray Place into both nostrils daily.    [provider]  fluticasone (FLOVENT HFA) 110 MCG/ACT inhaler Inhale into the lungs 2 (two) times daily.    [provider]  fluticasone furoate-vilanterol (BREO ELLIPTA) 200-25 MCG/INH AEPB Inhale 1 puff into the lungs daily.    [provider]  gabapentin (NEURONTIN) 600 MG tablet Take 600 mg by mouth 3 (three) times daily.    [provider]  glycopyrrolate (ROBINUL) 1 MG tablet  Take 1 mg by mouth 3 (three) times daily.    [provider]  HYDROcodone-acetaminophen (NORCO/VICODIN) 5-325 MG tablet Take 1 tablet by mouth every 6 (six) hours as needed for moderate pain. 10/27/18   [name], PA-C  hydrOXYzine (VISTARIL) 50 MG capsule  07/05/17   [provider]  ibuprofen (ADVIL,MOTRIN) 200 MG tablet Take 400 mg by mouth every 8 (eight) hours as needed.    [provider]  lurasidone (LATUDA) 40 MG TABS tablet Take 160 mg by mouth daily with breakfast.    [provider]  metFORMIN (GLUCOPHAGE) 1000 MG tablet Take 1,000 mg by mouth 2 (two) times daily with a meal.    [provider]  mirtazapine (REMERON) 45 MG tablet Take 45 mg by mouth at bedtime.    [provider]  montelukast (SINGULAIR) 10 MG tablet Take 10 mg by mouth at bedtime.    [provider]  omeprazole (PRILOSEC) 10 MG capsule  06/29/17   [provider]  propranolol (INDERAL) 80 MG tablet Take 80 mg by mouth 2 (two) times daily.    [provider]  rivaroxaban (XARELTO) 20 MG TABS tablet Take 20 mg by mouth daily with supper.    [provider]  tiotropium (SPIRIVA) 18 MCG inhalation capsule Place 18 mcg into inhaler and inhale daily.    [provider]    Physical Exam: Vitals:   07/18/19 0913 07/18/19 0915  BP: (!) 159/79   Pulse: 64   Resp: 18   Temp: 98.4 F (36.9 C)   TempSrc: Oral   SpO2: 96%   Weight:  90.7 kg  Height:  5' (1.524 m)     Vitals:   07/18/19 0913 07/18/19 0915  BP: (!) 159/79   Pulse: 64   Resp: 18   Temp: 98.4 F (36.9 C)   TempSrc: Oral   SpO2: 96%   Weight:  90.7 kg  Height:  5' (1.524 m)    Constitutional: NAD, alert and oriented x 3.  Noted to have some tremors in her upper extremity Eyes: PERRL, lids and conjunctivae normal ENMT: Mucous membranes are moist.  Neck: normal, supple, no masses, no thyromegaly Respiratory: clear to auscultation bilaterally, no  wheezing, no crackles. Normal respiratory effort. No accessory muscle use.  Cardiovascular: Regular rate and rhythm, no murmurs / rubs / gallops. No extremity edema. 2+ pedal pulses. No carotid bruits.  Abdomen: no tenderness, no masses palpated. No hepatosplenomegaly. Bowel sounds positive.  Musculoskeletal: no clubbing / cyanosis.  Skin tears over both knees and ecchymosis over both forearms from falls Skin: no rashes, lesions, ulcers.  Neurologic: No gross focal neurologic deficit.  Generalized weakness Psychiatric: Normal mood and affect.   Labs on Admission: I have personally reviewed following labs and imaging studies  CBC: Recent Labs  Lab 07/18/19 1000  WBC 9.4  NEUTROABS 4.8  HGB 12.5  HCT 38.1  MCV 84.5  PLT 386   Basic Metabolic Panel: Recent Labs  Lab 07/18/19 1000  NA 138  K 4.8  CL 97*  CO2 27  GLUCOSE 96  BUN 29*  CREATININE 1.43*  CALCIUM 9.8   GFR: Estimated Creatinine Clearance: 36.8 mL/min (A) (by C-G formula based on SCr of 1.43 mg/dL (H)). Liver Function Tests: Recent Labs  Lab 07/18/19 1000  AST 28  ALT 27  ALKPHOS 76  BILITOT 0.8  PROT 7.1  ALBUMIN 4.2   No results for input(s): LIPASE, AMYLASE in the last 168 hours. No results for input(s): AMMONIA in the last 168 hours. Coagulation Profile: No results for input(s): INR, PROTIME in the last 168 hours. Cardiac Enzymes: No results for input(s): CKTOTAL, CKMB, CKMBINDEX, TROPONINI in the last 168 hours. BNP (last 3 results) No results for input(s): PROBNP in the last 8760 hours. HbA1C: No results for input(s): HGBA1C in the last 72 hours. CBG: No results for input(s): GLUCAP in the last 168 hours. Lipid Profile: No results for input(s): CHOL, HDL, LDLCALC, TRIG, CHOLHDL, LDLDIRECT in the last 72 hours. Thyroid Function Tests: No results for input(s): TSH, T4TOTAL, FREET4, T3FREE, THYROIDAB in the last 72 hours. Anemia Panel: No results for input(s): VITAMINB12, FOLATE, FERRITIN,  TIBC, IRON, RETICCTPCT in the last 72 hours. Urine analysis:    Component Value Date/Time   COLORURINE YELLOW (A) 07/18/2019 1000   APPEARANCEUR CLOUDY (A) 07/18/2019 1000   LABSPEC 1.011 07/18/2019 1000   PHURINE 7.0 07/18/2019 1000   GLUCOSEU NEGATIVE 07/18/2019 1000   HGBUR NEGATIVE 07/18/2019 1000   BILIRUBINUR NEGATIVE 07/18/2019 1000   KETONESUR NEGATIVE 07/18/2019 1000   PROTEINUR NEGATIVE 07/18/2019 1000   NITRITE NEGATIVE 07/18/2019 1000   LEUKOCYTESUR MODERATE (A) 07/18/2019 1000    Radiological Exams on Admission: CT Head Wo Contrast  Result Date: 07/18/2019 CLINICAL DATA:  Ataxia. EXAM: CT HEAD WITHOUT CONTRAST TECHNIQUE: Contiguous axial images were obtained from the base of the skull through the vertex without intravenous contrast. COMPARISON:  October 27, 2018. FINDINGS: Brain: Mild diffuse cortical atrophy is noted. Mild chronic ischemic white matter disease is noted. No mass effect or midline shift is noted. Ventricular size is within normal limits. There is no evidence of mass lesion, hemorrhage or acute infarction. Vascular: No hyperdense vessel or unexpected calcification. Skull: Normal. Negative for fracture or focal lesion. Sinuses/Orbits: No acute finding. Other: None. IMPRESSION: Mild diffuse cortical atrophy. Mild chronic ischemic white matter disease. No acute intracranial abnormality seen. Electronically Signed   By: Lupita Raider M.D.   On: 07/18/2019 09:57    EKG: Independently reviewed.   Assessment/Plan Principal Problem:   AKI (acute kidney injury) (HCC) Active Problems:   Hypertension   GERD (gastroesophageal reflux disease)   Diabetes mellitus without complication (HCC)   Depression   Chronic fatigue   Anxiety   Acute lower UTI   Frequent falls   Obesity, Class III, BMI 40-49.9 (morbid obesity) (HCC)     Acute kidney injury At baseline patient has a serum creatinine of 0.85 today on admission it is 1.43 Etiology unclear but will obtain  total CK levels to rule out mild rhabdomyolysis Hydrate patient Hold angiotensin receptor blocking agents Repeat renal parameters in a.m.   Frequent falls Place patient on fall precautions Request PT evaluation   Urinary tract infection Patient has pyuria We will place patient on IV Rocephin Follow-up results of urine culture   Hypertension Continue clonidine and propanolol Monitor patient closely  for bradycardia   Depression Continue Lurasidone, Lexapro and Remeron   History of DVT Continue Xarelto    COPD Not acutely exacerbated Continue as needed bronchodilator therapy and Spiriva   Diabetes mellitus Hold Metformin Maintain consistent carbohydrate diet Sliding scale coverage with insulin   Morbid Obesity (BMI 39) Complicates overall prognosis and care     DVT prophylaxis: Xarelto Code Status: Full code Family Communication: Greater than 50% of time was spent discussing plan of care with patient at the bedside.  All questions and concerns have been addressed. Disposition Plan: Back to previous home environment Consults called: Physical therapy    Marijayne Rauth MD [facility]     07/18/2019, 2:02 PM

## 2019-07-18 NOTE — Evaluation (Signed)
Physical Therapy Evaluation Patient Details Name: Leslie Gould MRN: [id number] DOB: 03/29/1950 Today's Date: 07/18/2019   History of Present Illness  Pt is a 69 y.o. female who presented to the ER for evaluation of frequent falls as well as intermittent tremor.  PMH includes: anxiety, asthma, chronic fatigue, clotting disorder, DM, fibromyalgia, GERD, and HTN.    Clinical Impression  Pt pleasant and motivated to participate during the session.  Pt was Ind with bed mobility and transfers with good control and stability.  Pt initially ambulated 30 feet without an AD short, choppy steps and slow cadence and became anxious and fearful of falling, given HHA back to bed. Pt then ambulated 150 feet with a RW with improved cadence/confidence with no LOB.  Pt reported having had 8-10 falls in the last two weeks along with a history of occasional falls over the last two years and owns no AD.  Pt will benefit from HHPT services upon discharge to safely address deficits listed in patient problem list for decreased caregiver assistance and eventual return to PLOF.  Pt also encouraged to elicit family/friends for supervision with mobility once back home.      Follow Up Recommendations Home health PT;Supervision for mobility/OOB    Equipment Recommendations  Rolling walker with 5" wheels    Recommendations for Other Services       Precautions / Restrictions Precautions Precautions: Fall Restrictions Weight Bearing Restrictions: No      Mobility  Bed Mobility Overal bed mobility: Independent                Transfers Overall transfer level: Independent Equipment used: Rolling walker (2 wheeled);None             General transfer comment: Good eccentric and concentric control  Ambulation/Gait Ambulation/Gait assistance: Supervision Gait Distance (Feet): 150 Feet Assistive device: Rolling walker (2 wheeled);None Gait Pattern/deviations: Step-through pattern;Decreased step length  - right;Decreased step length - left Gait velocity: decreased   General Gait Details: Pt ambulated 30 feet without an AD and became anxious and fearful of falling, given HHA back to bed; pt then ambulated 150 feet with a RW and improved cadence/confidence with no LOB  Stairs            Wheelchair Mobility    Modified Rankin (Stroke Patients Only)       Balance Overall balance assessment: Needs assistance   Sitting balance-Leahy Scale: Good     Standing balance support: Bilateral upper extremity supported;During functional activity Standing balance-Leahy Scale: Fair                               Pertinent Vitals/Pain Pain Assessment: No/denies pain    Home Living Family/patient expects to be discharged to:: Private residence Living Arrangements: Alone Available Help at Discharge: Family;Available PRN/intermittently Type of Home: Apartment Home Access: Ramped entrance     Home Layout: One level Home Equipment: None      Prior Function Level of Independence: Independent         Comments: Ind amb without AD, 8-10 falls in the last 2 wks but h/o falls over the last 2 years, Ind with ADLs, takes public transportation for errands     Hand Dominance        Extremity/Trunk Assessment   Upper Extremity Assessment Upper Extremity Assessment: Overall WFL for tasks assessed    Lower Extremity Assessment Lower Extremity Assessment: Generalized weakness  Communication   Communication: No difficulties  Cognition Arousal/Alertness: Awake/alert Behavior During Therapy: WFL for tasks assessed/performed Overall Cognitive Status: Within Functional Limits for tasks assessed                                        General Comments      Exercises     Assessment/Plan    PT Assessment Patient needs continued PT services  PT Problem List Decreased strength;Decreased activity tolerance;Decreased balance;Decreased  mobility;Decreased knowledge of use of DME       PT Treatment Interventions DME instruction;Gait training;Functional mobility training;Therapeutic activities;Therapeutic exercise;Balance training;Patient/family education    PT Goals (Current goals can be found in the Care Plan section)  Acute Rehab PT Goals Patient Stated Goal: Improved balance PT Goal Formulation: With patient Time For Goal Achievement: 07/31/19 Potential to Achieve Goals: Good    Frequency Min 2X/week   Barriers to discharge        Co-evaluation               AM-PAC PT "6 Clicks" Mobility  Outcome Measure Help needed turning from your back to your side while in a flat bed without using bedrails?: None Help needed moving from lying on your back to sitting on the side of a flat bed without using bedrails?: None Help needed moving to and from a bed to a chair (including a wheelchair)?: None Help needed standing up from a chair using your arms (e.g., wheelchair or bedside chair)?: None Help needed to walk in hospital room?: A Little Help needed climbing 3-5 steps with a railing? : A Little 6 Click Score: 22    End of Session Equipment Utilized During Treatment: Gait belt Activity Tolerance: Patient tolerated treatment well Patient left: in bed;Other (comment) (Pt in bed in hall of ED next to nsg station) Nurse Communication: Mobility status PT Visit Diagnosis: Muscle weakness (generalized) (M62.81);Difficulty in walking, not elsewhere classified (R26.2);Repeated falls (R29.6);Unsteadiness on feet (R26.81)    Time: 0981-1914 PT Time Calculation (min) (ACUTE ONLY): 26 min   Charges:   PT Evaluation $PT Eval Moderate Complexity: 1 Mod          D. Scott Vernard Gram PT, DPT 07/18/19, 4:10 PM

## 2019-07-18 NOTE — ED Notes (Signed)
Resumed care from dejea rn.  Pt alert. Pt in hallway bed.  Family with pt  Pt waiting on admission.

## 2019-07-18 NOTE — ED Notes (Signed)
Pt eating lunch tray  

## 2019-07-19 ENCOUNTER — Other Ambulatory Visit: Payer: Self-pay

## 2019-07-19 DIAGNOSIS — R296 Repeated falls: Secondary | ICD-10-CM

## 2019-07-19 LAB — CBC
HCT: 35.1 % — ABNORMAL LOW (ref 36.0–46.0)
Hemoglobin: 11.2 g/dL — ABNORMAL LOW (ref 12.0–15.0)
MCH: 27.6 pg (ref 26.0–34.0)
MCHC: 31.9 g/dL (ref 30.0–36.0)
MCV: 86.5 fL (ref 80.0–100.0)
Platelets: 345 10*3/uL (ref 150–400)
RBC: 4.06 MIL/uL (ref 3.87–5.11)
RDW: 15.1 % (ref 11.5–15.5)
WBC: 7.3 10*3/uL (ref 4.0–10.5)
nRBC: 0 % (ref 0.0–0.2)

## 2019-07-19 LAB — HEMOGLOBIN A1C
Hgb A1c MFr Bld: 5.7 % — ABNORMAL HIGH (ref 4.8–5.6)
Mean Plasma Glucose: 117 mg/dL

## 2019-07-19 LAB — GLUCOSE, CAPILLARY
Glucose-Capillary: 79 mg/dL (ref 70–99)
Glucose-Capillary: 78 mg/dL (ref 70–99)
Glucose-Capillary: 80 mg/dL (ref 70–99)
Glucose-Capillary: 104 mg/dL — ABNORMAL HIGH (ref 70–99)

## 2019-07-19 LAB — BASIC METABOLIC PANEL
Anion gap: 10 (ref 5–15)
BUN: 21 mg/dL (ref 8–23)
CO2: 28 mmol/L (ref 22–32)
Calcium: 8.8 mg/dL — ABNORMAL LOW (ref 8.9–10.3)
Chloride: 103 mmol/L (ref 98–111)
Creatinine, Ser: 1.22 mg/dL — ABNORMAL HIGH (ref 0.44–1.00)
GFR calc Af Amer: 52 mL/min — ABNORMAL LOW (ref >60)
GFR calc non Af Amer: 45 mL/min — ABNORMAL LOW (ref >60)
Glucose, Bld: 112 mg/dL — ABNORMAL HIGH (ref 70–99)
Potassium: 3.8 mmol/L (ref 3.5–5.1)
Sodium: 141 mmol/L (ref 135–145)

## 2019-07-19 MED ORDER — DULOXETINE HCL 30 MG PO CPEP
30.0000 mg | ORAL_CAPSULE | Freq: Every day | ORAL | Status: DC
  Administered 2019-07-19 – 2019-07-20 (×2): 30 mg via ORAL
  Filled 2019-07-19 (×2): qty 1

## 2019-07-19 MED ORDER — TRAZODONE HCL 50 MG PO TABS: 50.0000 mg | ORAL_TABLET | Freq: Every evening | ORAL | Status: DC | PRN

## 2019-07-19 MED ORDER — ATORVASTATIN CALCIUM 20 MG PO TABS
10.0000 mg | ORAL_TABLET | Freq: Every evening | ORAL | Status: DC
  Administered 2019-07-19: 10 mg via ORAL
  Filled 2019-07-19: qty 1

## 2019-07-19 MED ORDER — VITAMIN B-12 1000 MCG PO TABS
1000.0000 ug | ORAL_TABLET | Freq: Every day | ORAL | Status: DC
  Administered 2019-07-19 – 2019-07-20 (×2): 1000 ug via ORAL
  Filled 2019-07-19 (×2): qty 1

## 2019-07-19 MED ORDER — DOXEPIN HCL 100 MG PO CAPS
300.0000 mg | ORAL_CAPSULE | Freq: Every day | ORAL | Status: DC
  Administered 2019-07-19: 21:00:00 300 mg via ORAL
  Filled 2019-07-19 (×2): qty 3

## 2019-07-19 MED ORDER — VITAMIN D 25 MCG (1000 UNIT) PO TABS
1000.0000 [IU] | ORAL_TABLET | Freq: Every day | ORAL | Status: DC
  Administered 2019-07-19 – 2019-07-20 (×2): 1000 [IU] via ORAL
  Filled 2019-07-19 (×2): qty 1

## 2019-07-19 MED ORDER — HYDRALAZINE HCL 50 MG PO TABS: 25.0000 mg | ORAL_TABLET | Freq: Four times a day (QID) | ORAL | Status: DC | PRN

## 2019-07-19 MED ORDER — CITALOPRAM HYDROBROMIDE 20 MG PO TABS
20.0000 mg | ORAL_TABLET | Freq: Every day | ORAL | Status: DC
  Administered 2019-07-19 – 2019-07-20 (×2): 20 mg via ORAL
  Filled 2019-07-19 (×2): qty 1

## 2019-07-19 MED ORDER — LOSARTAN POTASSIUM 50 MG PO TABS: 100.0000 mg | ORAL_TABLET | Freq: Every day | ORAL | Status: DC

## 2019-07-19 MED ORDER — AMANTADINE HCL 100 MG PO CAPS
100.0000 mg | ORAL_CAPSULE | Freq: Two times a day (BID) | ORAL | Status: DC
  Administered 2019-07-19 – 2019-07-20 (×2): 100 mg via ORAL
  Filled 2019-07-19 (×4): qty 1

## 2019-07-19 MED ORDER — ASCORBIC ACID 500 MG PO TABS
500.0000 mg | ORAL_TABLET | Freq: Every day | ORAL | Status: DC
  Administered 2019-07-19 – 2019-07-20 (×2): 500 mg via ORAL
  Filled 2019-07-19 (×2): qty 1

## 2019-07-19 NOTE — Progress Notes (Signed)
Chaplain completed Surveyor, mining. Patient advised she want to discuss matter with her daughter. Chaplain listen as patient shared her issues with falling and getting hurt. Chaplain offered ministry of presence, prayer of comfort and emotional support.

## 2019-07-19 NOTE — Progress Notes (Signed)
Physical Therapy Treatment Patient Details Name: Leslie Gould MRN: [id number] DOB: [birth year] Today's Date: 07/19/2019    History of Present Illness Pt is a 69 y.o. female who presented to the ER for evaluation of frequent falls as well as intermittent tremor.  PMH includes: anxiety, asthma, chronic fatigue, clotting disorder, DM, fibromyalgia, GERD, and HTN.    PT Comments    Pt pleasant and motivated to participate during the session.  Pt demonstrated good eccentric and concentric control and stability with transfers from various height surfaces including standard toilet height.  Pt was able to amb 200' with one short standing rest break using a RW.  Pt's cadence was slow with short B step length but was steady without LOB.  Pt continued to demonstrate the need for min to mod lean on the RW for support during ambulation and pt educated on recommendation that she continue to use a RW at home until told otherwise by her HHPT, pt understood and agreed.  Pt will benefit from HHPT upon discharge to safely address deficits listed in patient problem list for decreased caregiver assistance and eventual return to PLOF.     Follow Up Recommendations  Home health PT;Supervision for mobility/OOB     Equipment Recommendations  Rolling walker with 5" wheels    Recommendations for Other Services       Precautions / Restrictions Precautions Precautions: Fall Restrictions Weight Bearing Restrictions: No    Mobility  Bed Mobility Overal bed mobility: Independent                Transfers Overall transfer level: Modified independent Equipment used: Rolling walker (2 wheeled)             General transfer comment: Good eccentric and concentric control  Ambulation/Gait Ambulation/Gait assistance: Supervision Gait Distance (Feet): 200 Feet Assistive device: Rolling walker (2 wheeled);None Gait Pattern/deviations: Step-through pattern;Decreased step length - right;Decreased step  length - left Gait velocity: decreased   General Gait Details: Pt steady with amb with a RW with slow cadence and short B step length; min to mod verbal cues for ambulation closer to the [employer]    Modified Rankin (Stroke Patients Only)       Balance Overall balance assessment: Needs assistance   Sitting balance-Leahy Scale: Good     Standing balance support: Bilateral upper extremity supported;During functional activity Standing balance-Leahy Scale: Fair Standing balance comment: Min to mod lean on the RW for support                            Cognition Arousal/Alertness: Awake/alert Behavior During Therapy: WFL for tasks assessed/performed Overall Cognitive Status: Within Functional Limits for tasks assessed                                        Exercises Total Joint Exercises Ankle Circles/Pumps: AROM;Strengthening;Both;10 reps Quad Sets: Strengthening;Both;10 reps Hip ABduction/ADduction: Strengthening;Both;10 reps Long Arc Quad: Strengthening;10 reps;Both Knee Flexion: Strengthening;Both;10 reps Marching in Standing: Strengthening;Both;10 reps;Standing Other Exercises Other Exercises: Sit to/from stand transfer training from various height surfaces    General Comments        Pertinent Vitals/Pain Pain Assessment: No/denies pain    Home Living  Prior Function            PT Goals (current goals can now be found in the care plan section) Progress towards PT goals: Progressing toward goals    Frequency    Min 2X/week      PT Plan Current plan remains appropriate    Co-evaluation              AM-PAC PT "6 Clicks" Mobility   Outcome Measure  Help needed turning from your back to your side while in a flat bed without using bedrails?: None Help needed moving from lying on your back to sitting on the side of a flat bed without using  bedrails?: None Help needed moving to and from a bed to a chair (including a wheelchair)?: None Help needed standing up from a chair using your arms (e.g., wheelchair or bedside chair)?: None Help needed to walk in hospital room?: A Little Help needed climbing 3-5 steps with a railing? : A Little 6 Click Score: 22    End of Session Equipment Utilized During Treatment: Gait belt Activity Tolerance: Patient tolerated treatment well Patient left: in chair;with call bell/phone within reach;with chair alarm set Nurse Communication: Mobility status PT Visit Diagnosis: Muscle weakness (generalized) (M62.81);Difficulty in walking, not elsewhere classified (R26.2);Repeated falls (R29.6);Unsteadiness on feet (R26.81)     Time: 7253-6644 PT Time Calculation (min) (ACUTE ONLY): 23 min  Charges:  $Gait Training: 8-22 mins $Therapeutic Exercise: 8-22 mins                     D. Scott Nakoa Ganus PT, DPT 07/19/19, 5:22 PM

## 2019-07-19 NOTE — Hospital Course (Addendum)
Leslie Gould is a 69 y.o. female with medical history significant for depression, anxiety, GERD, fibromyalgia, diabetes mellitus and hypertension who presented to the ED from home on 07/18/19 via EMS for evaluation of frequent falls and tremors in the setting of having run out of her benzodiazepines which she takes chronically for generalized anxiety disorder.  She denied having any loss of consciousness or hitting her head with fall.  Reported associated generalized weakness and fatigue, urinary frequency and dysuria.   In the ED, labs notable for creatinine of 1.43 (baseline of 0.85).  CT head without contrast was negative for acute intracranial abnormalities, showed mild diffuse cortical atrophy and mild chronic ischemic white matter disease.  Admitted to hospitalist service for further evaluation and management.

## 2019-07-19 NOTE — Progress Notes (Signed)
PROGRESS NOTE    Leslie Gould   PXT:[id number]  DOB: 30-Jan-1950  PCP: Shane Crutch, PA    DOA: 07/18/2019 LOS: 1   Brief Narrative   Leslie Gould is a 69 y.o. female with medical history significant for depression, anxiety, GERD, fibromyalgia, diabetes mellitus and hypertension who presented to the ED from home on 07/18/19 via EMS for evaluation of frequent falls and tremors in the setting of having run out of her benzodiazepines which she takes chronically for generalized anxiety disorder.  She denied having any loss of consciousness or hitting her head with fall.  Reported associated generalized weakness and fatigue, urinary frequency and dysuria.   In the ED, labs notable for creatinine of 1.43 (baseline of 0.85).  CT head without contrast was negative for acute intracranial abnormalities, showed mild diffuse cortical atrophy and mild chronic ischemic white matter disease.  Admitted to hospitalist service for further evaluation and management.   Assessment & Plan   Principal Problem:   AKI (acute kidney injury) (HCC) Active Problems:   Hypertension   GERD (gastroesophageal reflux disease)   Diabetes mellitus without complication (HCC)   Depression   Chronic fatigue   Anxiety   Acute lower UTI   Frequent falls   Obesity, Class III, BMI 40-49.9 (morbid obesity) (HCC)   Acute kidney injury -present on admission with creatinine 1.43 (baseline 0.85).  Unclear etiology but is improving with IV hydration consistent with prerenal azotemia.  CK was checked and ruled out rhabdomyolysis.  Continue holding losartan and other nephrotoxins.  Avoid hypotension.  Continue IV fluids.  BMP in a.m.  Urinary tract infection -present on admission with urinary frequency and pyuria.  Follow-up urine culture.  Continue Rocephin.  Recurrent falls -patient attributes to a "neuromuscular disorder" and has been ongoing since 2001.  She does not believe her UTI has contributed to any worsening.   Fall precautions.  PT has evaluated and recommends home health physical therapy with out of bed supervision for mobility, rolling walker.  She has follow-up in August with neurologist, Dr. Malvin Johns.  Hypertension -chronic, poorly controlled.  Losartan held due to AKI.  Continue clonidine, propanolol.  As needed oral hydralazine.  Depression /generalized anxiety disorder -chronic and no current acute issues apparent.  Patient has been transitioning from duloxetine to Celexa as outpatient.  States she has been tried on many different antipsychotics which she does not tolerate due to increased neurologic symptoms.  Med history completed.  Resume Celexa and duloxetine.  DC lurasidone, Lexapro and Remeron (these were started on admission prior to med history obtained).  Close psych follow up outpatient.  History of DVT -continue Xarelto  COPD -not acutely exacerbated.  Continue as needed bronchodilator, continue Spiriva.  Type 2 diabetes -hold Metformin.  Sliding scale NovoLog and CBGs.  Obesity - Body mass index is 39.06 kg/m.  Complicates overall care diagnosis.   DVT prophylaxis:  rivaroxaban (XARELTO) tablet 20 mg   Diet:  Diet Orders (From admission, onward)    Start     Ordered   07/18/19 1400  Diet Carb Modified Fluid consistency: Thin; Room service appropriate? Yes  Diet effective now       Question Answer Comment  Diet-HS Snack? Nothing   Calorie Level Medium 1600-2000   Fluid consistency: Thin   Room service appropriate? Yes      07/18/19 1400            Code Status: Full Code    Subjective 07/19/19    Patient seen  sitting up edge of bed this morning.  She reports difficulty controlling her anxiety without Klonopin.  States her anxiety was fine while she was on this and it is the only thing that works.  She is says she is tried several antipsychotics and other medications that cause her worsening tremors and other neurologic symptoms.  She attributes her falls to these  medications.  She says she does see a neurologist and has follow-up in August.  States she was unable to sleep last night.  Denies other acute complaints including fevers or chills.  States urinary frequency has improved.   Disposition Plan & Communication   Status is: Inpatient  Remains inpatient appropriate because:IV treatments appropriate due to intensity of illness or inability to take PO   Dispo: The patient is from: Home              Anticipated d/c is to: Home              Anticipated d/c date is: 1 day              Patient currently is not medically stable to d/c.  Family Communication: None at bedside during encounter, will attempt to call   Consults, Procedures, Significant Events   Consultants:   None  Procedures:   None  Antimicrobials:   Rocephin   Objective   Vitals:   07/18/19 2317 07/19/19 0304 07/19/19 0800 07/19/19 1149  BP: 128/66 131/68 (!) 145/87 (!) 170/98  Pulse: 66 66 68 61  Resp: 17 17 18 17   Temp: 97.7 F (36.5 C) 97.9 F (36.6 C) (!) 97.5 F (36.4 C) 98.5 F (36.9 C)  TempSrc: Oral Oral Axillary Oral  SpO2: 95% 94%  96%  Weight:      Height:        Intake/Output Summary (Last 24 hours) at 07/19/2019 1415 Last data filed at 07/19/2019 1045 Gross per 24 hour  Intake 443.35 ml  Output --  Net 443.35 ml   Filed Weights   07/18/19 0915  Weight: 90.7 kg    Physical Exam:  General exam: awake, alert, no acute distress, obese HEENT: atraumatic, clear conjunctiva, anicteric sclera, dry mucus membranes, hearing grossly normal  Respiratory system: CTAB, no wheezes, rales or rhonchi, normal respiratory effort. Cardiovascular system: normal S1/S2, RRR, no JVD, murmurs, rubs, gallops, no pedal edema.   Gastrointestinal system: soft, NT, ND, no HSM felt, +bowel sounds. Central nervous system: A&O x4. no gross focal neurologic deficits, normal speech but with intermittent stuttering Extremities: moves all, no edema, normal tone Skin:  dry, intact, normal temperature, normal color Psychiatry: Anxious mood, congruent affect, judgement and insight appear normal  Labs   Data Reviewed: I have personally reviewed following labs and imaging studies  CBC: Recent Labs  Lab 07/18/19 1000 07/19/19 0421  WBC 9.4 7.3  NEUTROABS 4.8  --   HGB 12.5 11.2*  HCT 38.1 35.1*  MCV 84.5 86.5  PLT 386 345   Basic Metabolic Panel: Recent Labs  Lab 07/18/19 1000 07/19/19 0421  NA 138 141  K 4.8 3.8  CL 97* 103  CO2 27 28  GLUCOSE 96 112*  BUN 29* 21  CREATININE 1.43* 1.22*  CALCIUM 9.8 8.8*   GFR: Estimated Creatinine Clearance: 43.1 mL/min (A) (by C-G formula based on SCr of 1.22 mg/dL (H)). Liver Function Tests: Recent Labs  Lab 07/18/19 1000  AST 28  ALT 27  ALKPHOS 76  BILITOT 0.8  PROT 7.1  ALBUMIN 4.2  No results for input(s): LIPASE, AMYLASE in the last 168 hours. No results for input(s): AMMONIA in the last 168 hours. Coagulation Profile: No results for input(s): INR, PROTIME in the last 168 hours. Cardiac Enzymes: Recent Labs  Lab 07/18/19 1528  CKMB 4.4   BNP (last 3 results) No results for input(s): PROBNP in the last 8760 hours. HbA1C: Recent Labs    07/18/19 1528  HGBA1C 5.7*   CBG: Recent Labs  Lab 07/18/19 1731 07/18/19 2055 07/19/19 0744 07/19/19 1142  GLUCAP 96 103* 79 78   Lipid Profile: No results for input(s): CHOL, HDL, LDLCALC, TRIG, CHOLHDL, LDLDIRECT in the last 72 hours. Thyroid Function Tests: No results for input(s): TSH, T4TOTAL, FREET4, T3FREE, THYROIDAB in the last 72 hours. Anemia Panel: No results for input(s): VITAMINB12, FOLATE, FERRITIN, TIBC, IRON, RETICCTPCT in the last 72 hours. Sepsis Labs: No results for input(s): PROCALCITON, LATICACIDVEN in the last 168 hours.  Recent Results (from the past 240 hour(s))  SARS Coronavirus 2 by RT PCR (hospital order, performed in [facility] hospital lab) Nasopharyngeal Nasopharyngeal Swab     Status: None    Collection Time: 07/18/19  3:34 PM   Specimen: Nasopharyngeal Swab  Result Value Ref Range Status   SARS Coronavirus 2 NEGATIVE NEGATIVE Final    Comment: (NOTE) SARS-CoV-2 target nucleic acids are NOT DETECTED.  The SARS-CoV-2 RNA is generally detectable in upper and lower respiratory specimens during the acute phase of infection. The lowest concentration of SARS-CoV-2 viral copies this assay can detect is 250 copies / mL. A negative result does not preclude SARS-CoV-2 infection and should not be used as the sole basis for treatment or other patient management decisions.  A negative result may occur with improper specimen collection / handling, submission of specimen other than nasopharyngeal swab, presence of viral mutation(s) within the areas targeted by this assay, and inadequate number of viral copies (<250 copies / mL). A negative result must be combined with clinical observations, patient history, and epidemiological information.  Fact Sheet for Patients:   BoilerBrush.com.cy  Fact Sheet for Healthcare Providers: https://pope.com/  This test is not yet approved or  cleared by the [place] FDA and has been authorized for detection and/or diagnosis of SARS-CoV-2 by FDA under an Emergency Use Authorization (EUA).  This EUA will remain in effect (meaning this test can be used) for the duration of the COVID-19 declaration under Section 564(b)(1) of the Act, 21 U.S.C. section 360bbb-3(b)(1), unless the authorization is terminated or revoked sooner.  Performed at [facility], [address], [place], [place] [address]       Imaging Studies   CT Head Wo Contrast  Result Date: 07/18/2019 CLINICAL DATA:  Ataxia. EXAM: CT HEAD WITHOUT CONTRAST TECHNIQUE: Contiguous axial images were obtained from the base of the skull through the vertex without intravenous contrast. COMPARISON:  October 27, 2018. FINDINGS: Brain:  Mild diffuse cortical atrophy is noted. Mild chronic ischemic white matter disease is noted. No mass effect or midline shift is noted. Ventricular size is within normal limits. There is no evidence of mass lesion, hemorrhage or acute infarction. Vascular: No hyperdense vessel or unexpected calcification. Skull: Normal. Negative for fracture or focal lesion. Sinuses/Orbits: No acute finding. Other: None. IMPRESSION: Mild diffuse cortical atrophy. Mild chronic ischemic white matter disease. No acute intracranial abnormality seen. Electronically Signed   By: Lupita Raider M.D.   On: 07/18/2019 09:57     Medications   Scheduled Meds: . Amantadine HCl  150 mg Oral BID  . ascorbic acid  500 mg Oral Daily  . atorvastatin  10 mg Oral QPM  . azelastine  1 spray Each Nare BID  . benztropine  1 mg Oral BID  . cholecalciferol  1,000 Units Oral Daily  . citalopram  20 mg Oral Daily  . cloNIDine  0.1 mg Transdermal Weekly  . doxepin  300 mg Oral QHS  . DULoxetine  30-60 mg Oral Daily  . fluticasone  1 spray Each Nare Daily  . fluticasone furoate-vilanterol  1 puff Inhalation Daily  . gabapentin  600 mg Oral TID  . glycopyrrolate  1 mg Oral TID  . hydrOXYzine  50 mg Oral Daily  . insulin aspart  0-20 Units Subcutaneous TID WC  . losartan  100 mg Oral Daily  . lurasidone  160 mg Oral Q breakfast  . montelukast  10 mg Oral QHS  . pantoprazole  40 mg Oral Daily  . propranolol  80 mg Oral BID  . rivaroxaban  20 mg Oral Q supper  . sodium chloride flush  3 mL Intravenous Q12H  . tiotropium  18 mcg Inhalation Daily  . vitamin B-12  1,000 mcg Oral Daily   Continuous Infusions: . sodium chloride 100 mL/hr at 07/19/19 0455  . cefTRIAXone (ROCEPHIN)  IV 1 g (07/19/19 1156)       LOS: 1 day    Time spent: 30 minutes    Pennie Banter, DO [facility]  07/19/2019, 2:15 PM    If 7PM-7AM, please contact night-coverage. How to contact the [facility] Attending or Consulting provider 7A - 7P  or covering provider during after hours 7P -7A, for this patient?    1. Check the care team in [facility] and look for a) attending/consulting TR[facility] provider listed and b) the [facility] team listed 2. Log into [email or web address] and use [place]'s universal password to access. If you do not have the password, please contact the hospital operator. 3. Locate the [facility] provider you are looking for under [facility] and page to a number that you can be directly reached. 4. If you still have difficulty reaching the provider, please page the [facility] (Director on Call) for the Hospitalists listed on amion for assistance.

## 2019-07-19 NOTE — Plan of Care (Signed)
  Problem: Activity: Goal: Risk for activity intolerance will decrease Outcome: Progressing   Problem: Safety: Goal: Ability to remain free from injury will improve Outcome: Progressing   Problem: Skin Integrity: Goal: Risk for impaired skin integrity will decrease Outcome: Progressing   

## 2019-07-19 NOTE — TOC Progression Note (Signed)
Transition of Care [facility]) - Progression Note    Patient Details  Name: Leslie Gould MRN: [id number] Date of Birth: [birth year]  [facility] CM/SW Contact  Allayne Butcher, RN Phone Number: 07/19/2019, 3:25 PM  Clinical Narrative:     RNCM introduced self and explained role.  Patient's daughter Vaughan Basta is at the bedside.  [name] reports that she could use help at home with her mother.  The patient agrees to home health services.  Mellissa Kohut with [facility] accepted referral for home health RN, PT and SW.  Patient also agreed to a 3 in 1 but does not want a walker.  Patient and daughter provided information on local assisted living facilities and on local personal care services.  Patient has Medicaid, encouraged her to contact her Medicaid case worker and ask about long term care Medicaid, this would cover personal care services and even assisted living at certain facilities.   Patient's primary care provider at the [facility] should be able to do the paper work to apply for services at home.   Expected Discharge Plan: Home w Home Health Services Barriers to Discharge: Continued Medical Work up  Expected Discharge Plan and Services Expected Discharge Plan: Home w Home Health Services   Discharge Planning Services: CM Consult Post Acute Care Choice: Home Health Living arrangements for the past 2 months: Apartment                                       Social Determinants of Health (SDOH) Interventions    Readmission Risk Interventions No flowsheet data found.

## 2019-07-20 DIAGNOSIS — N390 Urinary tract infection, site not specified: Secondary | ICD-10-CM

## 2019-07-20 DIAGNOSIS — F419 Anxiety disorder, unspecified: Secondary | ICD-10-CM

## 2019-07-20 DIAGNOSIS — N179 Acute kidney failure, unspecified: Secondary | ICD-10-CM

## 2019-07-20 LAB — BASIC METABOLIC PANEL
Anion gap: 9 (ref 5–15)
BUN: 19 mg/dL (ref 8–23)
CO2: 25 mmol/L (ref 22–32)
Calcium: 8.9 mg/dL (ref 8.9–10.3)
Chloride: 105 mmol/L (ref 98–111)
Creatinine, Ser: 1.02 mg/dL — ABNORMAL HIGH (ref 0.44–1.00)
GFR calc Af Amer: >60 mL/min (ref >60)
GFR calc non Af Amer: 56 mL/min — ABNORMAL LOW (ref >60)
Glucose, Bld: 122 mg/dL — ABNORMAL HIGH (ref 70–99)
Potassium: 4.1 mmol/L (ref 3.5–5.1)
Sodium: 139 mmol/L (ref 135–145)

## 2019-07-20 LAB — MAGNESIUM: Magnesium: 1.7 mg/dL (ref 1.7–2.4)

## 2019-07-20 LAB — GLUCOSE, CAPILLARY
Glucose-Capillary: 109 mg/dL — ABNORMAL HIGH (ref 70–99)
Glucose-Capillary: 85 mg/dL (ref 70–99)

## 2019-07-20 MED ORDER — BENZTROPINE MESYLATE 1 MG PO TABS: 1.0000 mg | ORAL_TABLET | Freq: Two times a day (BID) | ORAL | 0 refills | Status: DC

## 2019-07-20 MED ORDER — CEFDINIR 300 MG PO CAPS: 300.0000 mg | ORAL_CAPSULE | Freq: Two times a day (BID) | ORAL | 0 refills | Status: DC

## 2019-07-20 MED ORDER — ASPIRIN-ACETAMINOPHEN-CAFFEINE 250-250-65 MG PO TABS
2.0000 | ORAL_TABLET | Freq: Four times a day (QID) | ORAL | Status: DC | PRN
  Administered 2019-07-20: 11:00:00 2 via ORAL
  Filled 2019-07-20 (×3): qty 2

## 2019-07-20 MED ORDER — CLONAZEPAM 1 MG PO TABS
1.0000 mg | ORAL_TABLET | Freq: Once | ORAL | Status: AC
  Administered 2019-07-20: 11:00:00 1 mg via ORAL
  Filled 2019-07-20: qty 1

## 2019-07-20 MED ORDER — TIOTROPIUM BROMIDE MONOHYDRATE 18 MCG IN CAPS: 18.0000 ug | ORAL_CAPSULE | Freq: Every day | RESPIRATORY_TRACT | 1 refills | Status: AC

## 2019-07-20 NOTE — Progress Notes (Signed)
Pt discharged via private vehicle. Discharge instructions explained and given to pt. No further questions at this time. Bedside commode and walker delivered to pt's room. No s/s of distress noted. IV removed

## 2019-07-20 NOTE — Discharge Summary (Signed)
Physician Discharge Summary  Leslie Gould YWV:[id number] DOB: 1950-08-17 DOA: 07/18/2019  PCP: Shane Crutch, PA  Admit date: 07/18/2019 Discharge date: 07/20/2019  Admitted From: home Disposition:  home  Recommendations for Outpatient Follow-up:  1. Follow up with PCP in 1-2 weeks 2. Please obtain BMP/CBC in one week 3. Please follow up on patient's anxiety medications and whether controlled on recent medication changes 4. Please follow up with neurology as previously scheduled  Home Health: Yes, PT  Equipment/Devices: Rolling walker   Discharge Condition: Stable  CODE STATUS: Full  Diet recommendation: Carb Modified    Discharge Diagnoses: Principal Problem:   AKI (acute kidney injury) (HCC) Active Problems:   Hypertension   GERD (gastroesophageal reflux disease)   Diabetes mellitus without complication (HCC)   Depression   Chronic fatigue   Anxiety   Acute lower UTI   Frequent falls   Obesity, Class III, BMI 40-49.9 (morbid obesity) (HCC)    Summary of HPI and Hospital Course:  Leslie Gould is a 69 y.o. female with medical history significant for depression, anxiety, GERD, fibromyalgia, diabetes mellitus and hypertension who presented to the ED from home on 07/18/19 via EMS for evaluation of frequent falls and tremors in the setting of having run out of her benzodiazepines which she takes chronically for generalized anxiety disorder.  She denied having any loss of consciousness or hitting her head with fall.  Reported associated generalized weakness and fatigue, urinary frequency and dysuria.   In the ED, labs notable for creatinine of 1.43 (baseline of 0.85).  CT head without contrast was negative for acute intracranial abnormalities, showed mild diffuse cortical atrophy and mild chronic ischemic white matter disease.  Admitted to hospitalist service for further evaluation and management.   Acute kidney injury -present on admission with creatinine 1.43 (baseline 0.85).   Unclear etiology improving with IV hydration consistent with prerenal azotemia / dehydration.  CK was checked and ruled out rhabdomyolysis.  Losartan was held, resumed on discharge.  Cr 1.0 at discharge.  BMP in 1 week follow up.  Urinary tract infection -present on admission with urinary frequency and pyuria.  Follow-up urine culture, still pending but no growth to date, but was collected after abx as initial urine not sent for culture.  Treated with Rocephin, Omnicef x 2 days on d/c to complete course.  Recurrent falls -patient attributes to a "neuromuscular disorder" and has been ongoing since 2001.  She does not believe her UTI has contributed to any worsening.  Fall precautions.  PT has evaluated and recommends home health physical therapy with out of bed supervision for mobility, rolling walker.  She has follow-up in August with neurologist, Dr. Malvin Johns.  Hypertension -chronic, poorly controlled.  Losartan held due to AKI and resumed on discharge.  Continue clonidine, propanolol.    Depression /generalized anxiety disorder / insomnia -chronic and no current acute issues apparent.  Patient has been transitioning from duloxetine to Celexa as outpatient.  States she has been tried on many different antipsychotics which she does not tolerate due to increased neurologic symptoms.  Med history completed.  Resume Celexa and duloxetine, trazodone, amantadine, Klonopin PRN bedtime.  Pt requested to continue Cogentin for her EPS symptoms.  Close psych follow up outpatient.  Left foot pain - due to her fall well before admission.  Pt is taping the lateral toes for comfort but denies pain with ROM or ambulation.  Offered to xray foot, in case it is fractured, but patient declined this.    History  of DVT -continue Xarelto  COPD -not acutely exacerbated.  Continue as needed bronchodilator, continue Spiriva.  Type 2 diabetes -covered with sliding scale NovoLog and CBGs.  Resumed home regimen at  discharge.  Obesity - Body mass index is 39.06 kg/m.  Complicates overall care diagnosis.   DVT prophylaxis:  rivaroxaban (XARELTO) tablet 20 mg   Discharge Instructions   Discharge Instructions    Call MD for:  extreme fatigue   Complete by: As directed    Call MD for:  persistant dizziness or light-headedness   Complete by: As directed    Call MD for:  temperature >100.4   Complete by: As directed    Diet - low sodium heart healthy   Complete by: As directed    Discharge instructions   Complete by: As directed    Take antibiotic (cefdinir aka Omnicef) twice per day tomorrow and Saturday for UTI.  Follow up with behavioral health and neurology as previously scheduled.   Increase activity slowly   Complete by: As directed      Allergies as of 07/20/2019      Reactions   Lithium Other (See Comments)   Tremors, nausea Tremors, nausea      Medication List    STOP taking these medications   OLANZapine 15 MG tablet Commonly known as: ZYPREXA     TAKE these medications   Amantadine HCl 100 MG tablet Take 150 mg by mouth 2 (two) times daily.   ascorbic acid 500 MG tablet Commonly known as: VITAMIN C Take 500 mg by mouth daily.   atorvastatin 10 MG tablet Commonly known as: LIPITOR Take 10 mg by mouth every evening.   azelastine 0.1 % nasal spray Commonly known as: ASTELIN Place 2 sprays into both nostrils daily as needed for rhinitis or allergies.   benztropine 1 MG tablet Commonly known as: COGENTIN Take 1 tablet (1 mg total) by mouth 2 (two) times daily.   Breo Ellipta 200-25 MCG/INH Aepb Generic drug: fluticasone furoate-vilanterol Inhale 1 puff into the lungs daily.   cefdinir 300 MG capsule Commonly known as: OMNICEF Take 1 capsule (300 mg total) by mouth 2 (two) times daily.   cholecalciferol 25 MCG (1000 UNIT) tablet Commonly known as: VITAMIN D3 Take 1,000 Units by mouth daily.   citalopram 20 MG tablet Commonly known as: CELEXA Take  20-30 mg by mouth See admin instructions. Take 1 tablet (20mg ) by mouth daily for 7 days then take 1 tablets (30mg ) daily thereafter   clonazePAM 1 MG tablet Commonly known as: KLONOPIN Take 1 mg by mouth at bedtime as needed for anxiety.   cloNIDine 0.3 mg/24hr patch Commonly known as: CATAPRES - Dosed in mg/24 hr Place 0.3 mg onto the skin once a week.   doxepin 100 MG capsule Commonly known as: SINEQUAN Take 300 mg by mouth at bedtime.   DULoxetine 30 MG capsule Commonly known as: CYMBALTA Take 30-60 mg by mouth See admin instructions. Take 2 capsules (60mg ) by mouth daily for 7 days then take 1 capsule (30mg ) by mouth daily for 7 days then stop   fluticasone 50 MCG/ACT nasal spray Commonly known as: FLONASE Place 1 spray into both nostrils daily as needed for allergies or rhinitis.   gabapentin 600 MG tablet Commonly known as: NEURONTIN Take 600 mg by mouth 3 (three) times daily.   glycopyrrolate 2 MG tablet Commonly known as: ROBINUL Take 2 mg by mouth 3 (three) times daily.   hydrOXYzine 50 MG capsule Commonly known  as: VISTARIL Take 50-100 mg by mouth at bedtime as needed for anxiety (sleep).   losartan 100 MG tablet Commonly known as: COZAAR Take 100 mg by mouth daily.   metFORMIN 1000 MG tablet Commonly known as: GLUCOPHAGE Take 1,000 mg by mouth 2 (two) times daily with a meal.   omeprazole 40 MG capsule Commonly known as: PRILOSEC Take 40 mg by mouth daily.   propranolol 80 MG tablet Commonly known as: INDERAL Take 80 mg by mouth 2 (two) times daily.   rivaroxaban 20 MG Tabs tablet Commonly known as: XARELTO Take 20 mg by mouth daily with supper.   tiotropium 18 MCG inhalation capsule Commonly known as: SPIRIVA Place 1 capsule (18 mcg total) into inhaler and inhale daily. What changed: when to take this   Victoza 18 MG/3ML Sopn Generic drug: liraglutide Inject 1.8 mg into the skin daily.   vitamin B-12 1000 MCG tablet Commonly known as:  CYANOCOBALAMIN Take 1,000 mcg by mouth daily.       Allergies  Allergen Reactions  . Lithium Other (See Comments)    Tremors, nausea Tremors, nausea    Consultations:  None    Procedures/Studies: CT Head Wo Contrast  Result Date: 07/18/2019 CLINICAL DATA:  Ataxia. EXAM: CT HEAD WITHOUT CONTRAST TECHNIQUE: Contiguous axial images were obtained from the base of the skull through the vertex without intravenous contrast. COMPARISON:  October 27, 2018. FINDINGS: Brain: Mild diffuse cortical atrophy is noted. Mild chronic ischemic white matter disease is noted. No mass effect or midline shift is noted. Ventricular size is within normal limits. There is no evidence of mass lesion, hemorrhage or acute infarction. Vascular: No hyperdense vessel or unexpected calcification. Skull: Normal. Negative for fracture or focal lesion. Sinuses/Orbits: No acute finding. Other: None. IMPRESSION: Mild diffuse cortical atrophy. Mild chronic ischemic white matter disease. No acute intracranial abnormality seen. Electronically Signed   By: Lupita RaiderJames  Green Jr M.D.   On: 07/18/2019 09:57         Subjective: Patient seen eating breakfast this AM.  Reports headache and requests Excedrin.  Says she gets headaches with poor sleep. Denies dysuria, urinary frequency, fever or chills.  No other complaints.  Daughter on speakerphone during encounter.   Discharge Exam: Vitals:   07/20/19 0434 07/20/19 0737  BP: (!) 148/87 (!) 160/88  Pulse: 65 70  Resp: 16 16  Temp: 98.5 F (36.9 C) 98.3 F (36.8 C)  SpO2: 92% 95%   Vitals:   07/19/19 1957 07/19/19 2334 07/20/19 0434 07/20/19 0737  BP: (!) 157/84 (!) 152/78 (!) 148/87 (!) 160/88  Pulse: 68 69 65 70  Resp: 18 16 16 16   Temp: 98 F (36.7 C) (!) 97.4 F (36.3 C) 98.5 F (36.9 C) 98.3 F (36.8 C)  TempSrc: Oral Oral Oral   SpO2: 94% 94% 92% 95%  Weight:      Height:        General: Pt is alert, awake, not in acute distress Cardiovascular: RRR, S1/S2  +, no rubs, no gallops Respiratory: CTA bilaterally, no wheezing, no rhonchi Abdominal: Soft, NT, ND, bowel sounds + Extremities: left lateral toes with tape, no pain on AROM or weight bearing, no edema, no cyanosis    The results of significant diagnostics from this hospitalization (including imaging, microbiology, ancillary and laboratory) are listed below for reference.     Microbiology: Recent Results (from the past 240 hour(s))  SARS Coronavirus 2 by RT PCR (hospital order, [facility]) Nasopharyngeal Nasopharyngeal  Swab     Status: None   Collection Time: 07/18/19  3:34 PM   Specimen: Nasopharyngeal Swab  Result Value Ref Range Status   SARS Coronavirus 2 NEGATIVE NEGATIVE Final    Comment: (NOTE) SARS-CoV-2 target nucleic acids are NOT DETECTED.  The SARS-CoV-2 RNA is generally detectable in upper and lower respiratory specimens during the acute phase of infection. The lowest concentration of SARS-CoV-2 viral copies this assay can detect is 250 copies / mL. A negative result does not preclude SARS-CoV-2 infection and should not be used as the sole basis for treatment or other patient management decisions.  A negative result may occur with improper specimen collection / handling, submission of specimen other than nasopharyngeal swab, presence of viral mutation(s) within the areas targeted by this assay, and inadequate number of viral copies (<250 copies / mL). A negative result must be combined with clinical observations, patient history, and epidemiological information.  Fact Sheet for Patients:   BoilerBrush.com.cy  Fact Sheet for Healthcare Providers: https://pope.com/  This test is not yet approved or  cleared by the [place] FDA and has been authorized for detection and/or diagnosis of SARS-CoV-2 by FDA under an Emergency Use Authorization (EUA).  This EUA will remain in effect (meaning  this test can be used) for the duration of the COVID-19 declaration under Section 564(b)(1) of the Act, 21 U.S.C. section 360bbb-3(b)(1), unless the authorization is terminated or revoked sooner.  Performed at [facility], [address] [place], [place] [address]      Labs: BNP (last 3 results) No results for input(s): BNP in the last 8760 hours. Basic Metabolic Panel: Recent Labs  Lab 07/18/19 1000 07/19/19 0421 07/20/19 0537  NA 138 141 139  K 4.8 3.8 4.1  CL 97* 103 105  CO2 27 28 25   GLUCOSE 96 112* 122*  BUN 29* 21 19  CREATININE 1.43* 1.22* 1.02*  CALCIUM 9.8 8.8* 8.9  MG  --   --  1.7   Liver Function Tests: Recent Labs  Lab 07/18/19 1000  AST 28  ALT 27  ALKPHOS 76  BILITOT 0.8  PROT 7.1  ALBUMIN 4.2   No results for input(s): LIPASE, AMYLASE in the last 168 hours. No results for input(s): AMMONIA in the last 168 hours. CBC: Recent Labs  Lab 07/18/19 1000 07/19/19 0421  WBC 9.4 7.3  NEUTROABS 4.8  --   HGB 12.5 11.2*  HCT 38.1 35.1*  MCV 84.5 86.5  PLT 386 345   Cardiac Enzymes: Recent Labs  Lab 07/18/19 1528  CKMB 4.4   BNP: Invalid input(s): POCBNP CBG: Recent Labs  Lab 07/19/19 0744 07/19/19 1142 07/19/19 1643 07/19/19 2240 07/20/19 0728  GLUCAP 79 78 80 104* 109*   D-Dimer No results for input(s): DDIMER in the last 72 hours. Hgb A1c Recent Labs    07/18/19 1528  HGBA1C 5.7*   Lipid Profile No results for input(s): CHOL, HDL, LDLCALC, TRIG, CHOLHDL, LDLDIRECT in the last 72 hours. Thyroid function studies No results for input(s): TSH, T4TOTAL, T3FREE, THYROIDAB in the last 72 hours.  Invalid input(s): FREET3 Anemia work up No results for input(s): VITAMINB12, FOLATE, FERRITIN, TIBC, IRON, RETICCTPCT in the last 72 hours. Urinalysis    Component Value Date/Time   COLORURINE YELLOW (A) 07/18/2019 1000   APPEARANCEUR CLOUDY (A) 07/18/2019 1000   LABSPEC 1.011 07/18/2019 1000   PHURINE 7.0 07/18/2019  1000   GLUCOSEU NEGATIVE 07/18/2019 1000   HGBUR NEGATIVE 07/18/2019 1000   BILIRUBINUR  NEGATIVE 07/18/2019 1000   KETONESUR NEGATIVE 07/18/2019 1000   PROTEINUR NEGATIVE 07/18/2019 1000   NITRITE NEGATIVE 07/18/2019 1000   LEUKOCYTESUR MODERATE (A) 07/18/2019 1000   Sepsis Labs Invalid input(s): PROCALCITONIN,  WBC,  LACTICIDVEN Microbiology Recent Results (from the past 240 hour(s))  SARS Coronavirus 2 by RT PCR (hospital order, performed in [facility] hospital lab) Nasopharyngeal Nasopharyngeal Swab     Status: None   Collection Time: 07/18/19  3:34 PM   Specimen: Nasopharyngeal Swab  Result Value Ref Range Status   SARS Coronavirus 2 NEGATIVE NEGATIVE Final    Comment: (NOTE) SARS-CoV-2 target nucleic acids are NOT DETECTED.  The SARS-CoV-2 RNA is generally detectable in upper and lower respiratory specimens during the acute phase of infection. The lowest concentration of SARS-CoV-2 viral copies this assay can detect is 250 copies / mL. A negative result does not preclude SARS-CoV-2 infection and should not be used as the sole basis for treatment or other patient management decisions.  A negative result may occur with improper specimen collection / handling, submission of specimen other than nasopharyngeal swab, presence of viral mutation(s) within the areas targeted by this assay, and inadequate number of viral copies (<250 copies / mL). A negative result must be combined with clinical observations, patient history, and epidemiological information.  Fact Sheet for Patients:   BoilerBrush.com.cy  Fact Sheet for Healthcare Providers: https://pope.com/  This test is not yet approved or  cleared by the [place] FDA and has been authorized for detection and/or diagnosis of SARS-CoV-2 by FDA under an Emergency Use Authorization (EUA).  This EUA will remain in effect (meaning this test can be used) for the duration of  the COVID-19 declaration under Section 564(b)(1) of the Act, 21 U.S.C. section 360bbb-3(b)(1), unless the authorization is terminated or revoked sooner.  Performed at [facility], [address] [place], [place] [address]      Time coordinating discharge: Over 30 minutes  SIGNED:   Pennie Banter, DO [facility] 07/20/2019, 9:08 AM   If 7PM-7AM, please contact night-coverage [email or web address]

## 2019-07-20 NOTE — TOC Transition Note (Signed)
Transition of Care [facility]) - CM/SW Discharge Note   Patient Details  Name: Leslie Gould MRN: [id number] Date of Birth: Jan 03, 1951  Transition of Care [facility]) CM/SW Contact:  Allayne Butcher, RN Phone Number: 07/20/2019, 11:22 AM   Clinical Narrative:    Patient is medically stable for discharge home today with home health.  [facility] will provide home health services for RN, PT, OT, and SW.  Mellissa Kohut with [facility] is aware of discharge today.  Patient will also go home with Walker and 3 in 1, which will be delivered to the room before patient goes home.  Patient's son will be picking her up today.    Final next level of care: Home w Home Health Services Barriers to Discharge: Barriers Resolved   Patient Goals and CMS Choice Patient states their goals for this hospitalization and ongoing recovery are:: Patient wants PCS services to help her at home CMS Medicare.gov Compare Post Acute Care list provided to:: Patient Choice offered to / list presented to : Patient, Adult Children  Discharge Placement                       Discharge Plan and Services   Discharge Planning Services: CM Consult Post Acute Care Choice: Home Health          DME Arranged: Walker rolling, 3-N-1 DME Agency: [employer] Date DME Agency Contacted: 07/20/19 Time DME Agency Contacted: 1000 Representative spoke with at DME Agency: Oletha Cruel HH Arranged: RN, PT, OT, Social Work [employer] Agency: [facility] [employer] [facility] [employer] Date HH Agency Contacted: 07/20/19 Time HH Agency Contacted: 1121 Representative spoke with at [facility] Agency: Mellissa Kohut  Social Determinants of Health (SDOH) Interventions     Readmission Risk Interventions No flowsheet data found.

## 2019-07-20 NOTE — Progress Notes (Signed)
CH paged to 1C to help pt. complete AD; pt. sitting in chair at bedside.  AD completed and sitting on computer in rm. but pt. wanted to discuss Living Will further w/CH.  Pt does not want life-prolonging measures in any of the three situations described and does not want to 'linger'.  Pt. had questions re: what #3 on LW means --> how being 'made as comfortable as possible' might also 'hasten' pt.'s death.  CH helped pt. understand that by initialing that she does not want life-prolonging treatment in all three situations described on form (she had already initialed this) she was saying she did not want her dying process artificially stretched out; LW as she has completed it indicates she would  like to die naturally and peacefully; this is comparatively faster death, but is following body's natural dying process rather than extending it, CH explained.  Pt. had misread question re: feeding tube in #2 and CH helped pt. clarify (initials crossed out now).  Pt. leaves her HCA free to make decisions that differ from LW if they see fit.  Pt. expressed desire to be cremated, be considered for organ donation, and for her sons to be allowed to also participate in medical decisionmaking at the discretion of dtr. who is made HCA. CH assisted in getting document notarized w/witnesses present; copy placed in paper chart and brought to MR for scanning.  Pt. grateful for [facility] support and assistance w/AD.  Pt. discharged this afternoon shortly after conversation.    07/20/19 1330  Clinical Encounter Type  Visited With Patient  Visit Type Initial;Social support;Psychological support (Advanced Directive completion)  Referral From Nurse;Patient  Spiritual Encounters  Spiritual Needs Emotional  Stress Factors  Patient Stress Factors Major life changes;Health changes

## 2019-07-20 NOTE — Plan of Care (Signed)
  Problem: Safety: Goal: Ability to remain free from injury will improve Outcome: Progressing   Problem: Skin Integrity: Goal: Risk for impaired skin integrity will decrease Outcome: Progressing   

## 2019-07-21 LAB — URINE CULTURE: Culture: NO GROWTH

## 2019-07-23 ENCOUNTER — Emergency Department: Payer: Medicare HMO

## 2019-07-23 ENCOUNTER — Other Ambulatory Visit: Payer: Self-pay

## 2019-07-23 ENCOUNTER — Emergency Department
Admission: EM | Admit: 2019-07-23 | Discharge: 2019-07-23 | Disposition: A | Discharge status: Home / Self Care | Payer: Medicare HMO | Attending: Emergency Medicine | Admitting: Emergency Medicine

## 2019-07-23 ENCOUNTER — Encounter: Payer: Self-pay | Admitting: Intensive Care

## 2019-07-23 DIAGNOSIS — J45909 Unspecified asthma, uncomplicated: Secondary | ICD-10-CM | POA: Diagnosis not present

## 2019-07-23 DIAGNOSIS — Y929 Unspecified place or not applicable: Secondary | ICD-10-CM | POA: Diagnosis not present

## 2019-07-23 DIAGNOSIS — E119 Type 2 diabetes mellitus without complications: Secondary | ICD-10-CM | POA: Diagnosis not present

## 2019-07-23 DIAGNOSIS — I10 Essential (primary) hypertension: Secondary | ICD-10-CM | POA: Insufficient documentation

## 2019-07-23 DIAGNOSIS — F419 Anxiety disorder, unspecified: Secondary | ICD-10-CM | POA: Diagnosis not present

## 2019-07-23 DIAGNOSIS — Z7951 Long term (current) use of inhaled steroids: Secondary | ICD-10-CM | POA: Insufficient documentation

## 2019-07-23 DIAGNOSIS — Z79899 Other long term (current) drug therapy: Secondary | ICD-10-CM | POA: Insufficient documentation

## 2019-07-23 DIAGNOSIS — W19XXXA Unspecified fall, initial encounter: Secondary | ICD-10-CM | POA: Insufficient documentation

## 2019-07-23 DIAGNOSIS — Z7901 Long term (current) use of anticoagulants: Secondary | ICD-10-CM | POA: Diagnosis not present

## 2019-07-23 DIAGNOSIS — Z86718 Personal history of other venous thrombosis and embolism: Secondary | ICD-10-CM | POA: Insufficient documentation

## 2019-07-23 DIAGNOSIS — S91115A Laceration without foreign body of left lesser toe(s) without damage to nail, initial encounter: Secondary | ICD-10-CM

## 2019-07-23 DIAGNOSIS — Y999 Unspecified external cause status: Secondary | ICD-10-CM | POA: Insufficient documentation

## 2019-07-23 DIAGNOSIS — S92512B Displaced fracture of proximal phalanx of left lesser toe(s), initial encounter for open fracture: Secondary | ICD-10-CM | POA: Diagnosis not present

## 2019-07-23 DIAGNOSIS — S99922A Unspecified injury of left foot, initial encounter: Secondary | ICD-10-CM | POA: Diagnosis present

## 2019-07-23 DIAGNOSIS — Y939 Activity, unspecified: Secondary | ICD-10-CM | POA: Diagnosis not present

## 2019-07-23 LAB — BASIC METABOLIC PANEL
Anion gap: 10 (ref 5–15)
BUN: 25 mg/dL — ABNORMAL HIGH (ref 8–23)
CO2: 27 mmol/L (ref 22–32)
Calcium: 9.2 mg/dL (ref 8.9–10.3)
Chloride: 95 mmol/L — ABNORMAL LOW (ref 98–111)
Creatinine, Ser: 1.08 mg/dL — ABNORMAL HIGH (ref 0.44–1.00)
GFR calc Af Amer: >60 mL/min (ref >60)
GFR calc non Af Amer: 52 mL/min — ABNORMAL LOW (ref >60)
Glucose, Bld: 132 mg/dL — ABNORMAL HIGH (ref 70–99)
Potassium: 4.4 mmol/L (ref 3.5–5.1)
Sodium: 132 mmol/L — ABNORMAL LOW (ref 135–145)

## 2019-07-23 LAB — CBC
HCT: 38.3 % (ref 36.0–46.0)
Hemoglobin: 12.7 g/dL (ref 12.0–15.0)
MCH: 27.2 pg (ref 26.0–34.0)
MCHC: 33.2 g/dL (ref 30.0–36.0)
MCV: 82 fL (ref 80.0–100.0)
Platelets: 394 10*3/uL (ref 150–400)
RBC: 4.67 MIL/uL (ref 3.87–5.11)
RDW: 15 % (ref 11.5–15.5)
WBC: 8.4 10*3/uL (ref 4.0–10.5)
nRBC: 0 % (ref 0.0–0.2)

## 2019-07-23 MED ORDER — CLONAZEPAM 0.5 MG PO TABS
1.0000 mg | ORAL_TABLET | Freq: Once | ORAL | Status: AC
  Administered 2019-07-23: 1 mg via ORAL
  Filled 2019-07-23: qty 2

## 2019-07-23 MED ORDER — SODIUM CHLORIDE 0.9 % IV BOLUS
500.0000 mL | Freq: Once | INTRAVENOUS | Status: AC
  Administered 2019-07-23: 500 mL via INTRAVENOUS

## 2019-07-23 MED ORDER — SODIUM CHLORIDE 0.9 % IV SOLN
1.0000 g | Freq: Once | INTRAVENOUS | Status: AC
  Administered 2019-07-23: 1 g via INTRAVENOUS
  Filled 2019-07-23: qty 10

## 2019-07-23 MED ORDER — SULFAMETHOXAZOLE-TRIMETHOPRIM 800-160 MG PO TABS
1.0000 | ORAL_TABLET | Freq: Two times a day (BID) | ORAL | 0 refills | Status: DC
Start: 2019-07-23 — End: 2019-07-31

## 2019-07-23 NOTE — ED Triage Notes (Signed)
Patient seen and admitted on 07/18/19-07/20/19 for weakness and frequent falls. Patient arrived today by EMS from home for left foot pain. Deformity noted to left foot, middle toe. Left foot, fourth digit also has laceration noted and toes are red and swollen. Patient reports waking up this AM with foot problems. Patient also c/o anxiety and was not sent home with anxiety medication. A&O x4 during triage

## 2019-07-23 NOTE — ED Provider Notes (Signed)
[facility] Emergency Department Provider Note  ____________________________________________  Time seen: Approximately 10:09 AM  I have reviewed the triage vital signs and the nursing notes.   HISTORY  Chief Complaint Foot Pain    HPI Chevella Pearce is a 69 y.o. female with past medical history of anxiety, diabetes, fibromyalgia, hypertension that presents to the emergency department for evaluation of foot pain and anxiety after being discharged from the hospital 3 days ago.  Patient was admitted to the emergency department for frequent falls, urinary tract infection, AKI.  She was discharged on Omnicef and states that she has finished prescription.  Patient states that she had injured her foot on June 25.  Patient states that when she fell, her toes splayed open.  She states that her foot was not addressed during her hospital stay.  She woke up this morning and her toes were more painful and swollen.  Patient is also requesting Klonopin for her anxiety.  She was given a dose of Klonopin in the hospital but was not discharged with any.  Patient denies falling since being discharged from the hospital.   Patient was seen in this hospital on June 30 for frequent falling.  Falls were attributed to a "neuromuscular disorder "that has been ongoing since 2001.  Physical therapy evaluated patient in the hospital and recommends home health physical therapy.  She has a follow-up in August with Dr. Malvin Johns.  Past Medical History:  Diagnosis Date  . Allergy   . Anxiety   . Asthma   . Chronic fatigue   . Clotting disorder (HCC)   . Depression   . Diabetes mellitus without complication (HCC)   . Fibromyalgia   . GERD (gastroesophageal reflux disease)   . Hypertension     Patient Active Problem List   Diagnosis Date Noted  . AKI (acute kidney injury) (HCC) 07/18/2019  . Acute lower UTI 07/18/2019  . Frequent falls 07/18/2019  . Obesity, Class III, BMI 40-49.9 (morbid  obesity) (HCC) 07/18/2019  . History of recurrent deep vein thrombosis (DVT) 07/08/2017  . Allergic rhinitis 07/08/2017  . Hypertension   . GERD (gastroesophageal reflux disease)   . Fibromyalgia   . Diabetes mellitus without complication (HCC)   . Depression   . Chronic fatigue   . Asthma   . Anxiety     Past Surgical History:  Procedure Laterality Date  . APPENDECTOMY    . AUGMENTATION MAMMAPLASTY Bilateral 1980  . CESAREAN SECTION    . TONSILLECTOMY    . TUBAL LIGATION      Prior to Admission medications   Medication Sig Start Date End Date Taking? Authorizing Provider  Amantadine HCl 100 MG tablet Take 150 mg by mouth 2 (two) times daily. 07/04/19   [provider]  ascorbic acid (VITAMIN C) 500 MG tablet Take 500 mg by mouth daily.    [provider]  atorvastatin (LIPITOR) 10 MG tablet Take 10 mg by mouth every evening. 07/11/19   [provider]  azelastine (ASTELIN) 0.1 % nasal spray Place 2 sprays into both nostrils daily as needed for rhinitis or allergies.     [provider]  benztropine (COGENTIN) 1 MG tablet Take 1 tablet (1 mg total) by mouth 2 (two) times daily. 07/20/19   Pennie Banter, DO  cefdinir (OMNICEF) 300 MG capsule Take 1 capsule (300 mg total) by mouth 2 (two) times daily. 07/20/19   Esaw Grandchild A, DO  cholecalciferol (VITAMIN D3) 25 MCG (1000 UNIT)  tablet Take 1,000 Units by mouth daily.    [provider]  citalopram (CELEXA) 20 MG tablet Take 20-30 mg by mouth See admin instructions. Take 1 tablet ( ) by mouth daily for 7 days then take 1 tablets ( ) daily thereafter 07/13/19 07/27/19  [provider]  clonazePAM (KLONOPIN) 1 MG tablet Take 1 mg by mouth at bedtime as needed for anxiety.    [provider]  cloNIDine (CATAPRES - DOSED IN MG/24 HR) 0.3 mg/24hr patch Place 0.3 mg onto the skin once a week.     [provider]  doxepin (SINEQUAN) 100 MG capsule Take 300 mg by  mouth at bedtime. 07/17/19   [provider]  DULoxetine (CYMBALTA) 30 MG capsule Take 30-60 mg by mouth See admin instructions. Take 2 capsules ( ) by mouth daily for 7 days then take 1 capsule ( ) by mouth daily for 7 days then stop 07/13/19 07/27/19  [provider]  fluticasone (FLONASE) 50 MCG/ACT nasal spray Place 1 spray into both nostrils daily as needed for allergies or rhinitis.     [provider]  fluticasone furoate-vilanterol (BREO ELLIPTA) 200-25 MCG/INH AEPB Inhale 1 puff into the lungs daily.    [provider]  gabapentin (NEURONTIN) 600 MG tablet Take 600 mg by mouth 3 (three) times daily.    [provider]  glycopyrrolate (ROBINUL) 2 MG tablet Take 2 mg by mouth 3 (three) times daily.     [provider]  hydrOXYzine (VISTARIL) 50 MG capsule Take 50-100 mg by mouth at bedtime as needed for anxiety (sleep).     [provider]  losartan (COZAAR) 100 MG tablet Take 100 mg by mouth daily. 06/30/19   [provider]  metFORMIN (GLUCOPHAGE) 1000 MG tablet Take 1,000 mg by mouth 2 (two) times daily with a meal.    [provider]  omeprazole (PRILOSEC) 40 MG capsule Take 40 mg by mouth daily.     [provider]  propranolol (INDERAL) 80 MG tablet Take 80 mg by mouth 2 (two) times daily.    [provider]  rivaroxaban (XARELTO) 20 MG TABS tablet Take 20 mg by mouth daily with supper.    [provider]  sulfamethoxazole-trimethoprim (BACTRIM DS) 800-160 MG tablet Take 1 tablet by mouth 2 (two) times daily. 07/23/19   Enid Derry, PA-C  tiotropium (SPIRIVA) 18 MCG inhalation capsule Place 1 capsule (18 mcg total) into inhaler and inhale daily. 07/20/19   Esaw Grandchild A, DO  VICTOZA 18 MG/3ML SOPN Inject 1.8 mg into the skin daily. 06/22/19   [provider]  vitamin B-12 (CYANOCOBALAMIN) 1000 MCG tablet Take 1,000 mcg by mouth daily.    [provider]     Allergies Lithium  Family History  Problem Relation Age of Onset  . Anxiety disorder Mother   . Heart disease Father 85  . Anxiety disorder Daughter   . Breast cancer Paternal Grandmother 35  . Breast cancer Cousin   . Colon cancer Neg Hx   . Ovarian cancer Neg Hx     Social History Social History   Tobacco Use  . Smoking status: Never Smoker  . Smokeless tobacco: Never Used  Vaping Use  . Vaping Use: Never used  Substance Use Topics  . Alcohol use: Yes    Comment: rare  . Drug use: Never     Review of Systems  Constitutional: No fever/chills ENT: No upper respiratory complaints. Cardiovascular: No chest pain. Respiratory: No SOB.  Gastrointestinal: No abdominal pain.  No nausea, no vomiting.  Musculoskeletal: Positive for foot pain. Skin: Negative for rash, abrasions, ecchymosis. Positive for laceration. Neurological: Negative for headaches, numbness or tingling   ____________________________________________   PHYSICAL EXAM:  VITAL SIGNS: ED Triage Vitals  Enc Vitals Group     BP 07/23/19 0954 (!) 157/82     Pulse Rate 07/23/19 0954 66     Resp 07/23/19 0954 16     Temp 07/23/19 0954 98.9 F (37.2 C)     Temp Source 07/23/19 0954 Oral     SpO2 07/23/19 0954 95 %     Weight 07/23/19 0955 200 lb (90.7 kg)     Height 07/23/19 0955 5' (1.524 m)     Head Circumference --      Peak Flow --      Pain Score 07/23/19 0954 2     Pain Loc --      Pain Edu? --      Excl. in GC? --      Constitutional: Alert and oriented. Well appearing and in no acute distress. Eyes: Conjunctivae are normal. PERRL. EOMI. Head: Atraumatic. ENT:      Ears:      Nose: No congestion/rhinnorhea.      Mouth/Throat: Mucous membranes are moist.  Neck: No stridor.  Cardiovascular: Normal rate, regular rhythm.  Good peripheral circulation. Respiratory: Normal respiratory effort without tachypnea or retractions. Lungs CTAB. Good air entry to the bases with no decreased or  absent breath sounds. Gastrointestinal: Bowel sounds 4 quadrants. Soft and nontender to palpation. No guarding or rigidity. No palpable masses. No distention.  Musculoskeletal: Full range of motion to all extremities.  Deformity to third fourth and fifth toes of the left foot. Neurologic:  Normal speech and language. No gross focal neurologic deficits are appreciated.  Skin:  Skin is warm, dry.  Old laceration between third and fourth digit with some mild surrounding erythema. Psychiatric: Mood and affect are normal. Speech and behavior are normal. Patient exhibits appropriate insight and judgement.   ____________________________________________   LABS (all labs ordered are listed, but only abnormal results are displayed)  Labs Reviewed  BASIC METABOLIC PANEL - Abnormal; Notable for the following components:      Result Value   Sodium 132 (*)    Chloride 95 (*)    Glucose, Bld 132 (*)    BUN 25 (*)    Creatinine, Ser 1.08 (*)    GFR calc non Af Amer 52 (*)    All other components within normal limits  CBC   ____________________________________________  EKG   ____________________________________________  RADIOLOGY Lexine Baton, personally viewed and evaluated these images (plain radiographs) as part of my medical decision making, as well as reviewing the written report by the radiologist.  DG Foot Complete Left  Result Date: 07/23/2019 CLINICAL DATA:  Left foot injury five days ago due to fall. EXAM: LEFT FOOT - COMPLETE 3+ VIEW COMPARISON:  None. FINDINGS: Examination demonstrates a moderately displaced oblique fracture through the base of the fourth proximal phalanx as well as fifth proximal phalanx both involving the articular surface. Fusion of the second and third middle and proximal phalanges. Single orthopedic screw over the head of the second metatarsal. Mild degenerative changes over the midfoot. Small inferior calcaneal spur. IMPRESSION: Displaced oblique fractures  involving the fourth and fifth proximal phalanges. Electronically Signed   By: Elberta Fortis M.D.   On: 07/23/2019 10:24    ____________________________________________    PROCEDURES  Procedure(s) performed:    Procedures    Medications  cefTRIAXone (ROCEPHIN) 1 g in sodium chloride 0.9 % 100 mL IVPB (0 g Intravenous Stopped 07/23/19 1130)  sodium chloride 0.9 % bolus 500 mL (0 mLs Intravenous Stopped 07/23/19 1151)  clonazePAM (KLONOPIN) tablet 1 mg (1 mg Oral Given 07/23/19 1146)     ____________________________________________   INITIAL IMPRESSION / ASSESSMENT AND PLAN / ED COURSE  Pertinent labs & imaging results that were available during my care of the patient were reviewed by me and considered in my medical decision making (see chart for details).  Review of the [place] CSRS was performed in accordance of the [facility] prior to dispensing any controlled drugs.     Patient presented to the emergency department for evaluation of left foot pain after a fall 1 week ago and requesting clonazepam for her anxiety.  Vital signs and exam are reassuring.  Patient has fractures to the fourth and fifth digits of her left toes.  Toes were buddy taped.  She does have a deformity to her third toe, which patient states is chronic.  She has a laceration between the third and fourth toe with some mild surrounding erythema.  She was given a dose of IM ceftriaxone to cover for infection.  She was started on Bactrim.  She has finished her antibiotics that she was given in the emergency department for her urinary tract infection.  She denies any UTI symptoms.  CBC is unremarkable.  Kidney function is similar to her previous at discharge 3 days prior.  Patient has not had any falls since being discharged from the hospital.  Patient is also requesting clonazepam for her anxiety.  She is prescribed this from her mental health provider.  She states that she was not given any at discharge 3 days ago.  She states that  she is stuttering and having difficulty with tremors due to not having the clonazepam.  She does not take it every day.  She takes 1 mg at a time.  Patient was given 1 dose in the emergency department and encouraged to call her mental health provider tomorrow to discuss her medications.  Patient will be discharged home with prescriptions for Bactrim. Patient is to follow up with primary care and podiatry as directed. Patient is given ED precautions to return to the ED for any worsening or new symptoms.   Maryellen Dowdle was evaluated in Emergency Department on 07/23/2019 for the symptoms described in the history of present illness. She was evaluated in the context of the global COVID-19 pandemic, which necessitated consideration that the patient might be at risk for infection with the SARS-CoV-2 virus that causes COVID-19. Institutional protocols and algorithms that pertain to the evaluation of patients at risk for COVID-19 are in a state of rapid change based on information released by regulatory bodies including the CDC and federal and state organizations. These policies and algorithms were followed during the patient's care in the ED.  ____________________________________________  FINAL CLINICAL IMPRESSION(S) / ED DIAGNOSES  Final diagnoses:  Open displaced fracture of proximal phalanx of lesser toe of left foot, initial encounter  Laceration of lesser toe of left foot without foreign body present or damage to nail, initial encounter  Anxiety      NEW MEDICATIONS STARTED DURING THIS VISIT:  ED Discharge Orders         Ordered    sulfamethoxazole-trimethoprim (BACTRIM DS) 800-160 MG tablet  2 times daily     Discontinue  Reprint  07/23/19 1131              This chart was dictated using voice recognition software/Dragon. Despite best efforts to proofread, errors can occur which can change the meaning. Any change was purely unintentional.    Enid DerryWagner, Landi Biscardi, PA-C 07/23/19 1531     Sharyn CreamerQuale, Mark, MD 07/23/19 1556

## 2019-07-23 NOTE — Discharge Instructions (Addendum)
Please follow-up with your mental health provider to discuss your clonazepam.  You have a fracture in your fourth and fifth toe of your left foot.  Please keep toes wrapped. Use your walker. Please take Bactrim to prevent infection.  Your kidney function is similar to when you were discharged from the hospital like couple of days ago.  Please follow-up with primary care this week for recheck of your kidney function.  Please drink plenty of fluids.

## 2019-07-28 ENCOUNTER — Inpatient Hospital Stay
Admission: EM | Admit: 2019-07-28 | Discharge: 2019-07-31 | DRG: 683 | Disposition: A | Discharge status: Skilled Nursing Facility | Payer: Medicare HMO | Attending: Internal Medicine | Admitting: Internal Medicine

## 2019-07-28 ENCOUNTER — Emergency Department: Payer: Medicare HMO

## 2019-07-28 ENCOUNTER — Encounter: Payer: Self-pay | Admitting: Emergency Medicine

## 2019-07-28 ENCOUNTER — Other Ambulatory Visit: Payer: Self-pay

## 2019-07-28 DIAGNOSIS — Z888 Allergy status to other drugs, medicaments and biological substances status: Secondary | ICD-10-CM

## 2019-07-28 DIAGNOSIS — F332 Major depressive disorder, recurrent severe without psychotic features: Secondary | ICD-10-CM | POA: Diagnosis present

## 2019-07-28 DIAGNOSIS — Z7951 Long term (current) use of inhaled steroids: Secondary | ICD-10-CM

## 2019-07-28 DIAGNOSIS — R4182 Altered mental status, unspecified: Secondary | ICD-10-CM | POA: Diagnosis not present

## 2019-07-28 DIAGNOSIS — Z20822 Contact with and (suspected) exposure to covid-19: Secondary | ICD-10-CM | POA: Diagnosis present

## 2019-07-28 DIAGNOSIS — M797 Fibromyalgia: Secondary | ICD-10-CM | POA: Diagnosis present

## 2019-07-28 DIAGNOSIS — I10 Essential (primary) hypertension: Secondary | ICD-10-CM | POA: Diagnosis present

## 2019-07-28 DIAGNOSIS — E871 Hypo-osmolality and hyponatremia: Secondary | ICD-10-CM | POA: Diagnosis present

## 2019-07-28 DIAGNOSIS — F329 Major depressive disorder, single episode, unspecified: Secondary | ICD-10-CM

## 2019-07-28 DIAGNOSIS — Z7401 Bed confinement status: Secondary | ICD-10-CM | POA: Diagnosis not present

## 2019-07-28 DIAGNOSIS — N179 Acute kidney failure, unspecified: Principal | ICD-10-CM | POA: Diagnosis present

## 2019-07-28 DIAGNOSIS — F419 Anxiety disorder, unspecified: Secondary | ICD-10-CM

## 2019-07-28 DIAGNOSIS — E860 Dehydration: Secondary | ICD-10-CM | POA: Diagnosis present

## 2019-07-28 DIAGNOSIS — Z8249 Family history of ischemic heart disease and other diseases of the circulatory system: Secondary | ICD-10-CM | POA: Diagnosis not present

## 2019-07-28 DIAGNOSIS — J45909 Unspecified asthma, uncomplicated: Secondary | ICD-10-CM | POA: Diagnosis present

## 2019-07-28 DIAGNOSIS — Z79899 Other long term (current) drug therapy: Secondary | ICD-10-CM | POA: Diagnosis not present

## 2019-07-28 DIAGNOSIS — Z7984 Long term (current) use of oral hypoglycemic drugs: Secondary | ICD-10-CM

## 2019-07-28 DIAGNOSIS — R627 Adult failure to thrive: Secondary | ICD-10-CM | POA: Diagnosis present

## 2019-07-28 DIAGNOSIS — K219 Gastro-esophageal reflux disease without esophagitis: Secondary | ICD-10-CM | POA: Diagnosis present

## 2019-07-28 DIAGNOSIS — E119 Type 2 diabetes mellitus without complications: Secondary | ICD-10-CM | POA: Diagnosis present

## 2019-07-28 DIAGNOSIS — F411 Generalized anxiety disorder: Secondary | ICD-10-CM | POA: Diagnosis present

## 2019-07-28 DIAGNOSIS — Z803 Family history of malignant neoplasm of breast: Secondary | ICD-10-CM | POA: Diagnosis not present

## 2019-07-28 DIAGNOSIS — R531 Weakness: Secondary | ICD-10-CM | POA: Diagnosis not present

## 2019-07-28 DIAGNOSIS — Z818 Family history of other mental and behavioral disorders: Secondary | ICD-10-CM

## 2019-07-28 DIAGNOSIS — Z7901 Long term (current) use of anticoagulants: Secondary | ICD-10-CM | POA: Diagnosis not present

## 2019-07-28 LAB — ETHANOL: Alcohol, Ethyl (B): <10 mg/dL (ref <10)

## 2019-07-28 LAB — COMPREHENSIVE METABOLIC PANEL
ALT: 38 U/L (ref 0–44)
AST: 36 U/L (ref 15–41)
Albumin: 4.7 g/dL (ref 3.5–5.0)
Alkaline Phosphatase: 94 U/L (ref 38–126)
Anion gap: 19 — ABNORMAL HIGH (ref 5–15)
BUN: 16 mg/dL (ref 8–23)
CO2: 12 mmol/L — ABNORMAL LOW (ref 22–32)
Calcium: 9.5 mg/dL (ref 8.9–10.3)
Chloride: 96 mmol/L — ABNORMAL LOW (ref 98–111)
Creatinine, Ser: 1.12 mg/dL — ABNORMAL HIGH (ref 0.44–1.00)
GFR calc Af Amer: 58 mL/min — ABNORMAL LOW (ref >60)
GFR calc non Af Amer: 50 mL/min — ABNORMAL LOW (ref >60)
Glucose, Bld: 111 mg/dL — ABNORMAL HIGH (ref 70–99)
Potassium: 5 mmol/L (ref 3.5–5.1)
Sodium: 127 mmol/L — ABNORMAL LOW (ref 135–145)
Total Bilirubin: 1.7 mg/dL — ABNORMAL HIGH (ref 0.3–1.2)
Total Protein: 7.9 g/dL (ref 6.5–8.1)

## 2019-07-28 LAB — SALICYLATE LEVEL: Salicylate Lvl: <7 mg/dL — ABNORMAL LOW (ref 7.0–30.0)

## 2019-07-28 LAB — CBC
HCT: 43.5 % (ref 36.0–46.0)
Hemoglobin: 14.9 g/dL (ref 12.0–15.0)
MCH: 27.5 pg (ref 26.0–34.0)
MCHC: 34.3 g/dL (ref 30.0–36.0)
MCV: 80.4 fL (ref 80.0–100.0)
Platelets: 457 10*3/uL — ABNORMAL HIGH (ref 150–400)
RBC: 5.41 MIL/uL — ABNORMAL HIGH (ref 3.87–5.11)
RDW: 14.8 % (ref 11.5–15.5)
WBC: 10.9 10*3/uL — ABNORMAL HIGH (ref 4.0–10.5)
nRBC: 0 % (ref 0.0–0.2)

## 2019-07-28 LAB — TSH: TSH: 2.667 u[IU]/mL (ref 0.350–4.500)

## 2019-07-28 LAB — ACETAMINOPHEN LEVEL: Acetaminophen (Tylenol), Serum: <10 ug/mL — ABNORMAL LOW (ref 10–30)

## 2019-07-28 LAB — SARS CORONAVIRUS 2 BY RT PCR (HOSPITAL ORDER, PERFORMED IN ~~LOC~~ HOSPITAL LAB): SARS Coronavirus 2: NEGATIVE

## 2019-07-28 LAB — AMMONIA: Ammonia: 15 umol/L (ref 9–35)

## 2019-07-28 MED ORDER — BENZTROPINE MESYLATE 1 MG PO TABS: 1.0000 mg | ORAL_TABLET | Freq: Two times a day (BID) | ORAL | Status: DC

## 2019-07-28 MED ORDER — FLUTICASONE FUROATE-VILANTEROL 200-25 MCG/INH IN AEPB
1.0000 | INHALATION_SPRAY | Freq: Every day | RESPIRATORY_TRACT | Status: DC
  Administered 2019-07-29 – 2019-07-31 (×3): 1 via RESPIRATORY_TRACT
  Filled 2019-07-28: qty 28

## 2019-07-28 MED ORDER — GABAPENTIN 600 MG PO TABS: 600.0000 mg | ORAL_TABLET | Freq: Three times a day (TID) | ORAL | Status: DC

## 2019-07-28 MED ORDER — CLONIDINE HCL 0.3 MG/24HR TD PTWK
0.3000 mg | MEDICATED_PATCH | TRANSDERMAL | Status: DC
  Administered 2019-07-28: 0.3 mg via TRANSDERMAL
  Filled 2019-07-28: qty 1

## 2019-07-28 MED ORDER — SODIUM CHLORIDE 0.9 % IV SOLN: INTRAVENOUS | Status: DC

## 2019-07-28 MED ORDER — SODIUM CHLORIDE 0.9 % IV BOLUS
1000.0000 mL | Freq: Once | INTRAVENOUS | Status: AC
  Administered 2019-07-28: 1000 mL via INTRAVENOUS

## 2019-07-28 MED ORDER — CLONAZEPAM 1 MG PO TABS: 1.0000 mg | ORAL_TABLET | Freq: Two times a day (BID) | ORAL | Status: DC

## 2019-07-28 MED ORDER — AMANTADINE HCL 100 MG PO TABS: 150.0000 mg | ORAL_TABLET | Freq: Two times a day (BID) | ORAL | Status: DC

## 2019-07-28 MED ORDER — FLUTICASONE PROPIONATE 50 MCG/ACT NA SUSP
1.0000 | Freq: Every day | NASAL | Status: DC | PRN
  Filled 2019-07-28: qty 16

## 2019-07-28 MED ORDER — LORAZEPAM 2 MG/ML IJ SOLN
1.0000 mg | Freq: Once | INTRAMUSCULAR | Status: AC
  Administered 2019-07-28: 1 mg via INTRAVENOUS
  Filled 2019-07-28: qty 1

## 2019-07-28 MED ORDER — DOXEPIN HCL 100 MG PO CAPS
300.0000 mg | ORAL_CAPSULE | Freq: Every day | ORAL | Status: DC
  Administered 2019-07-28 – 2019-07-30 (×3): 300 mg via ORAL
  Filled 2019-07-28 (×4): qty 3

## 2019-07-28 MED ORDER — DULOXETINE HCL 30 MG PO CPEP: 30.0000 mg | ORAL_CAPSULE | Freq: Every day | ORAL | Status: DC

## 2019-07-28 MED ORDER — BISACODYL 5 MG PO TBEC: 5.0000 mg | DELAYED_RELEASE_TABLET | Freq: Every day | ORAL | Status: DC | PRN

## 2019-07-28 MED ORDER — ASCORBIC ACID 500 MG PO TABS: 500.0000 mg | ORAL_TABLET | Freq: Every day | ORAL | Status: DC

## 2019-07-28 MED ORDER — VITAMIN B-12 1000 MCG PO TABS
1000.0000 ug | ORAL_TABLET | Freq: Every day | ORAL | Status: DC
  Administered 2019-07-29 – 2019-07-31 (×3): 1000 ug via ORAL
  Filled 2019-07-28 (×3): qty 1

## 2019-07-28 MED ORDER — GABAPENTIN 300 MG PO CAPS
300.0000 mg | ORAL_CAPSULE | Freq: Three times a day (TID) | ORAL | Status: DC
  Administered 2019-07-28 – 2019-07-31 (×8): 300 mg via ORAL
  Filled 2019-07-28 (×8): qty 1

## 2019-07-28 MED ORDER — TIOTROPIUM BROMIDE MONOHYDRATE 18 MCG IN CAPS
18.0000 ug | ORAL_CAPSULE | Freq: Every day | RESPIRATORY_TRACT | Status: DC
  Administered 2019-07-29 – 2019-07-31 (×3): 18 ug via RESPIRATORY_TRACT
  Filled 2019-07-28: qty 5

## 2019-07-28 MED ORDER — ONDANSETRON HCL 4 MG PO TABS: 4.0000 mg | ORAL_TABLET | Freq: Four times a day (QID) | ORAL | Status: DC | PRN

## 2019-07-28 MED ORDER — CITALOPRAM HYDROBROMIDE 20 MG PO TABS: 30.0000 mg | ORAL_TABLET | Freq: Every day | ORAL | Status: DC

## 2019-07-28 MED ORDER — RIVAROXABAN 20 MG PO TABS
20.0000 mg | ORAL_TABLET | Freq: Every day | ORAL | Status: DC
  Administered 2019-07-28 – 2019-07-30 (×3): 20 mg via ORAL
  Filled 2019-07-28 (×4): qty 1

## 2019-07-28 MED ORDER — SODIUM CHLORIDE 0.9 % IV SOLN: Freq: Once | INTRAVENOUS | Status: AC

## 2019-07-28 MED ORDER — ONDANSETRON HCL 4 MG/2ML IJ SOLN: 4.0000 mg | Freq: Four times a day (QID) | INTRAMUSCULAR | Status: DC | PRN

## 2019-07-28 MED ORDER — LORAZEPAM 1 MG PO TABS
1.0000 mg | ORAL_TABLET | Freq: Four times a day (QID) | ORAL | Status: DC | PRN
  Administered 2019-07-28 – 2019-07-29 (×3): 1 mg via ORAL
  Filled 2019-07-28 (×3): qty 1

## 2019-07-28 MED ORDER — LIRAGLUTIDE 18 MG/3ML ~~LOC~~ SOPN: 1.8000 mg | PEN_INJECTOR | Freq: Every day | SUBCUTANEOUS | Status: DC

## 2019-07-28 MED ORDER — PROPRANOLOL HCL 20 MG PO TABS
80.0000 mg | ORAL_TABLET | Freq: Two times a day (BID) | ORAL | Status: DC
  Administered 2019-07-28 – 2019-07-30 (×4): 80 mg via ORAL
  Filled 2019-07-28 (×6): qty 4

## 2019-07-28 MED ORDER — ATORVASTATIN CALCIUM 10 MG PO TABS
10.0000 mg | ORAL_TABLET | Freq: Every evening | ORAL | Status: DC
  Administered 2019-07-28 – 2019-07-30 (×3): 10 mg via ORAL
  Filled 2019-07-28 (×3): qty 1

## 2019-07-28 MED ORDER — AMANTADINE HCL 100 MG PO CAPS
100.0000 mg | ORAL_CAPSULE | Freq: Two times a day (BID) | ORAL | Status: DC
  Administered 2019-07-28 – 2019-07-31 (×6): 100 mg via ORAL
  Filled 2019-07-28 (×7): qty 1

## 2019-07-28 MED ORDER — GLYCOPYRROLATE 1 MG PO TABS
2.0000 mg | ORAL_TABLET | Freq: Three times a day (TID) | ORAL | Status: DC
  Administered 2019-07-28 – 2019-07-31 (×8): 2 mg via ORAL
  Filled 2019-07-28 (×11): qty 2

## 2019-07-28 MED ORDER — PANTOPRAZOLE SODIUM 40 MG PO TBEC
40.0000 mg | DELAYED_RELEASE_TABLET | Freq: Every day | ORAL | Status: DC
  Administered 2019-07-29 – 2019-07-31 (×3): 40 mg via ORAL
  Filled 2019-07-28 (×3): qty 1

## 2019-07-28 MED ORDER — METFORMIN HCL 500 MG PO TABS
1000.0000 mg | ORAL_TABLET | Freq: Two times a day (BID) | ORAL | Status: DC
  Administered 2019-07-28 – 2019-07-30 (×4): 1000 mg via ORAL
  Filled 2019-07-28 (×4): qty 2

## 2019-07-28 MED ORDER — LOSARTAN POTASSIUM 50 MG PO TABS
100.0000 mg | ORAL_TABLET | Freq: Every day | ORAL | Status: DC
  Administered 2019-07-29 – 2019-07-31 (×3): 100 mg via ORAL
  Filled 2019-07-28 (×3): qty 2

## 2019-07-28 MED ORDER — ASCORBIC ACID 500 MG PO TABS
250.0000 mg | ORAL_TABLET | Freq: Every day | ORAL | Status: DC
  Administered 2019-07-29 – 2019-07-31 (×3): 250 mg via ORAL
  Filled 2019-07-28 (×3): qty 1

## 2019-07-28 MED ORDER — VITAMIN D 25 MCG (1000 UNIT) PO TABS
1000.0000 [IU] | ORAL_TABLET | Freq: Every day | ORAL | Status: DC
  Administered 2019-07-29 – 2019-07-31 (×3): 1000 [IU] via ORAL
  Filled 2019-07-28 (×3): qty 1

## 2019-07-28 MED ORDER — DOCUSATE SODIUM 100 MG PO CAPS
100.0000 mg | ORAL_CAPSULE | Freq: Two times a day (BID) | ORAL | Status: DC
  Administered 2019-07-28 – 2019-07-31 (×5): 100 mg via ORAL
  Filled 2019-07-28 (×6): qty 1

## 2019-07-28 MED ORDER — CLONAZEPAM 1 MG PO TABS
1.5000 mg | ORAL_TABLET | Freq: Two times a day (BID) | ORAL | Status: DC
  Administered 2019-07-28 – 2019-07-31 (×6): 1.5 mg via ORAL
  Filled 2019-07-28 (×6): qty 1

## 2019-07-28 MED ORDER — AZELASTINE HCL 0.1 % NA SOLN
2.0000 | Freq: Every day | NASAL | Status: DC | PRN
  Filled 2019-07-28: qty 30

## 2019-07-28 MED ORDER — HYDROXYZINE HCL 50 MG PO TABS
50.0000 mg | ORAL_TABLET | Freq: Every evening | ORAL | Status: DC | PRN
  Filled 2019-07-28: qty 2

## 2019-07-28 NOTE — Progress Notes (Signed)
Ch responded to PG from nurse. I went in Pt room. Pt want to know how she could get her son as the primary caregiver on her AD. Ch research her file and found out that her son was the primary and her daughter was secondary. Ch will follow-up if Pt or family have any more questions.

## 2019-07-28 NOTE — ED Triage Notes (Signed)
Pt to ED by EMS with AMS. Per EMS son states pt recently admitted for weakness. Pt also recently treated in this ED for fall with toe injury and since returning home son has concerns for pt being able to care for herself and believes she has not been taking her meds correctly.

## 2019-07-28 NOTE — ED Provider Notes (Signed)
[facility]  Time seen: 11:52 AM  I have reviewed the triage vital signs and the nursing notes.   HISTORY  Chief Complaint Altered Mental Status   HPI Leslie Gould is a 69 y.o. female with a past medical history anxiety, asthma, depression, diabetes, fibromyalgia, gastric reflux, hypertension, presents to the emergency department for reported status/confusion.  Patient is able to tell me she is in the hospital but unable to tell me the year.  When asked why she is here she states "I do not know."  Patient is very anxious in appearance, is moaning at times but denies any pain.  Per EMS report patient recently discharged in the hospital is at home with his son but is almost unable to care for her and thought she was not taking her medications.  We are waiting the son's arrival for further history.   Past Medical History:  Diagnosis Date  . Allergy   . Anxiety   . Asthma   . Chronic fatigue   . Clotting disorder (HCC)   . Depression   . Diabetes mellitus without complication (HCC)   . Fibromyalgia   . GERD (gastroesophageal reflux disease)   . Hypertension     Patient Active Problem List   Diagnosis Date Noted  . AKI (acute kidney injury) (HCC) 07/18/2019  . Acute lower UTI 07/18/2019  . Frequent falls 07/18/2019  . Obesity, Class III, BMI 40-49.9 (morbid obesity) (HCC) 07/18/2019  . History of recurrent deep vein thrombosis (DVT) 07/08/2017  . Allergic rhinitis 07/08/2017  . Hypertension   . GERD (gastroesophageal reflux disease)   . Fibromyalgia   . Diabetes mellitus without complication (HCC)   . Depression   . Chronic fatigue   . Asthma   . Anxiety     Past Surgical History:  Procedure Laterality Date  . APPENDECTOMY    . AUGMENTATION MAMMAPLASTY Bilateral 1980  . CESAREAN SECTION    . TONSILLECTOMY    . TUBAL LIGATION      Prior to Admission medications   Medication Sig Start Date End Date  Taking? Authorizing Provider  Amantadine HCl 100 MG tablet Take 150 mg by mouth 2 (two) times daily. 07/04/19   [provider]  ascorbic acid (VITAMIN C) 500 MG tablet Take 500 mg by mouth daily.    [provider]  atorvastatin (LIPITOR) 10 MG tablet Take 10 mg by mouth every evening. 07/11/19   [provider]  azelastine (ASTELIN) 0.1 % nasal spray Place 2 sprays into both nostrils daily as needed for rhinitis or allergies.     [provider]  benztropine (COGENTIN) 1 MG tablet Take 1 tablet (1 mg total) by mouth 2 (two) times daily. 07/20/19   Pennie Banter, DO  cefdinir (OMNICEF) 300 MG capsule Take 1 capsule (300 mg total) by mouth 2 (two) times daily. 07/20/19   Pennie Banter, DO  cholecalciferol (VITAMIN D3) 25 MCG (1000 UNIT) tablet Take 1,000 Units by mouth daily.    [provider]  citalopram (CELEXA) 20 MG tablet Take 20-30 mg by mouth See admin instructions. Take 1 tablet (20mg ) by mouth daily for 7 days then take 1 tablets (30mg ) daily thereafter 07/13/19 07/27/19  [provider]  clonazePAM (KLONOPIN) 1 MG tablet Take 1 mg by mouth at bedtime as needed for anxiety.    [provider]  cloNIDine (CATAPRES - DOSED IN MG/24 HR) 0.3 mg/24hr patch Place 0.3 mg onto the  skin once a week.     [provider]  doxepin (SINEQUAN) 100 MG capsule Take 300 mg by mouth at bedtime. 07/17/19   [provider]  DULoxetine (CYMBALTA) 30 MG capsule Take 30-60 mg by mouth See admin instructions. Take 2 capsules (60mg ) by mouth daily for 7 days then take 1 capsule (30mg ) by mouth daily for 7 days then stop 07/13/19 07/27/19  [provider]  fluticasone (FLONASE) 50 MCG/ACT nasal spray Place 1 spray into both nostrils daily as needed for allergies or rhinitis.     [provider]  fluticasone furoate-vilanterol (BREO ELLIPTA) 200-25 MCG/INH AEPB Inhale 1 puff into the lungs daily.    [provider]   gabapentin (NEURONTIN) 600 MG tablet Take 600 mg by mouth 3 (three) times daily.    [provider]  glycopyrrolate (ROBINUL) 2 MG tablet Take 2 mg by mouth 3 (three) times daily.     [provider]  hydrOXYzine (VISTARIL) 50 MG capsule Take 50-100 mg by mouth at bedtime as needed for anxiety (sleep).     [provider]  losartan (COZAAR) 100 MG tablet Take 100 mg by mouth daily. 06/30/19   [provider]  metFORMIN (GLUCOPHAGE) 1000 MG tablet Take 1,000 mg by mouth 2 (two) times daily with a meal.    [provider]  omeprazole (PRILOSEC) 40 MG capsule Take 40 mg by mouth daily.     [provider]  propranolol (INDERAL) 80 MG tablet Take 80 mg by mouth 2 (two) times daily.    [provider]  rivaroxaban (XARELTO) 20 MG TABS tablet Take 20 mg by mouth daily with supper.    [provider]  sulfamethoxazole-trimethoprim (BACTRIM DS) 800-160 MG tablet Take 1 tablet by mouth 2 (two) times daily. 07/23/19   08/30/19, PA-C  tiotropium (SPIRIVA) 18 MCG inhalation capsule Place 1 capsule (18 mcg total) into inhaler and inhale daily. 07/20/19   Enid Derry A, DO  VICTOZA 18 MG/3ML SOPN Inject 1.8 mg into the skin daily. 06/22/19   [provider]  vitamin B-12 (CYANOCOBALAMIN) 1000 MCG tablet Take 1,000 mcg by mouth daily.    [provider]    Allergies  Allergen Reactions  . Lithium Other (See Comments)    Tremors, nausea Tremors, nausea    Family History  Problem Relation Age of Onset  . Anxiety disorder Mother   . Heart disease Father 70  . Anxiety disorder Daughter   . Breast cancer Paternal Grandmother 71  . Breast cancer Cousin   . Colon cancer Neg Hx   . Ovarian cancer Neg Hx     Social History Social History   Tobacco Use  . Smoking status: Never Smoker  . Smokeless tobacco: Never Used  Vaping Use  . Vaping Use: Never used  Substance Use Topics  . Alcohol use: Yes    Comment:  rare  . Drug use: Never    Review of Systems Patient continues to state "I do not know" for most questions asked.  Unable to obtain adequate/accurate review of systems at this time secondary to altered mental status.  ____________________________________________   PHYSICAL EXAM:  VITAL SIGNS: ED Triage Vitals  Enc Vitals Group     BP 07/28/19 1142 (!) 171/99     Pulse --      Resp 07/28/19 1142 (!) 24     Temp 07/28/19 1142 98.2 F (36.8 C)     Temp Source 07/28/19 1142 Oral  SpO2 07/28/19 1142 100 %     Weight --      Height --      Head Circumference --      Peak Flow --      Pain Score 07/28/19 1144 0     Pain Loc --      Pain Edu? --      Excl. in GC? --    Constitutional: Patient is awake alert oriented to person and place only.  She is somewhat tremulous and appears moderately anxious. Eyes: Normal exam ENT      Head: Normocephalic and atraumatic.      Mouth/Throat: Mucous membranes are moist. Cardiovascular: Normal rate, regular rhythm. Respiratory: Normal respiratory effort without tachypnea nor retractions. Breath sounds are clear  Gastrointestinal: Soft and nontender. No distention.  Musculoskeletal: Good range of motion in bilateral lower extremities and upper extremities. Neurologic:  Normal speech and language. No gross focal neurologic deficits  Skin:  Skin is warm, dry and intact.  Psychiatric: Patient appears anxious.  ____________________________________________    EKG  EKG viewed and interpreted by myself shows a normal sinus rhythm 87 bpm with a narrow QRS, normal axis, normal intervals, nonspecific ST changes  ____________________________________________    RADIOLOGY  CT shows no acute abnormality. Chest x-ray shows no acute abnormality.  ____________________________________________   INITIAL IMPRESSION / ASSESSMENT AND PLAN / ED COURSE  Pertinent labs & imaging results that were available during my care of the patient were  reviewed by me and considered in my medical decision making (see chart for details).   Patient presents to the emergency department for altered mental status/confusion.  Patient states she does not know why she is here, appears anxious.  Awaiting son for further history.  We will check labs, chest x-ray, CT scan of the head, urinalysis and continue to closely monitor.  Patient's lab work shows hyponatremia with a sodium of 127, she appears quite dehydrated with a bicarb of 12 and a anion gap of 19.  Patient continues to appear quite anxious and tachypneic.  We will dose 1 mg of Ativan.  We will continue with IV hydration I have ordered a normal saline infusion.  Son is here with the patient who states that she has been declining over months but acutely over the past 1 week.  Believe the patient would benefit from rehab upon discharge from the hospital.  Patient will be admitted to the hospital service for further work-up and treatment.  Jayel Scaduto was evaluated in Emergency Department on 07/28/2019 for the symptoms described in the history of present illness. She was evaluated in the context of the global COVID-19 pandemic, which necessitated consideration that the patient might be at risk for infection with the SARS-CoV-2 virus that causes COVID-19. Institutional protocols and algorithms that pertain to the evaluation of patients at risk for COVID-19 are in a state of rapid change based on information released by regulatory bodies including the CDC and federal and state organizations. These policies and algorithms were followed during the patient's care in the ED.  ____________________________________________   FINAL CLINICAL IMPRESSION(S) / ED DIAGNOSES  Altered mental status Hyponatremia Dehydration   Minna Antis, MD 07/28/19 1354

## 2019-07-28 NOTE — Consult Note (Signed)
[facility] Face-to-Face Psychiatry Consult   Reason for Consult:   Severe anxiety, Possible dementia, depression and adjustment issues  Referring Physician:  ED MD  Patient Identification: Leslie Gould MRN:  [id number] Principal Diagnosis: <principal problem not specified> Diagnosis:  Active Problems:   Hyponatremia  Major depression Severe recurrent Generalized anxiety  Possible dementia  Worsening ADL's -- S/P falls recently  Worsening falls and sequelae ADL's compromised bedridden   Issues of Chills and fever ---vs anxiety  Not clear at this point as well    Total Time spent with patient: greater than hour    Subjective:   Leslie Gould is a 69 y.o. female patient admitted with Generalized Anxiety  Possible dementia ----adjustment issues and major depression possible --- Unclear reasons why she has generalized anxiety now --  Sequelae ---of major falls at home Son cannot manage at home.    HPI:  Came in with marked anxiety and adjustment issues ---unclear origin Sodium is lower and probably from SSRI 's  Along with issues of falls that have resulted in   Patient lives on her own ---and her functioning ---is in great question.   History mainly from son who is a poor historian as well   Daughter is on a boat and cannot answer questions she says   Her falls are worsening along with her remote recent and immediate memory are impaired    Son is seeking ---possible placement and NH --but it was explained that she first needs to go to [facility] for full assessment and mgt prior to placement   Past Psychiatric History:  --previous outpatient treatment no recent inpatient---  Risk to Self:   history of severe ongoing falls. No structured care at home    Risk to Others:  none  Prior Inpatient Therapy:  none  Prior Outpatient Therapy:  followed as outpatient --for medications   Past Medical History:  Past Medical History:  Diagnosis Date  . Allergy   . Anxiety    . Asthma   . Chronic fatigue   . Clotting disorder (HCC)   . Depression   . Diabetes mellitus without complication (HCC)   . Fibromyalgia   . GERD (gastroesophageal reflux disease)   . Hypertension     Past Surgical History:  Procedure Laterality Date  . APPENDECTOMY    . AUGMENTATION MAMMAPLASTY Bilateral 1980  . CESAREAN SECTION    . TONSILLECTOMY    . TUBAL LIGATION     Family History:  Family History  Problem Relation Age of Onset  . Anxiety disorder Mother   . Heart disease Father 16  . Anxiety disorder Daughter   . Breast cancer Paternal Grandmother 33  . Breast cancer Cousin   . Colon cancer Neg Hx   . Ovarian cancer Neg Hx    Family Psychiatric  History:    Social History:  Social History   Substance and Sexual Activity  Alcohol Use Yes   Comment: rare     Social History   Substance and Sexual Activity  Drug Use Never    Social History   Socioeconomic History  . Marital status: Single    Spouse name: Not on file  . Number of children: 3  . Years of education: 92  . Highest education level: Associate degree: academic program  Occupational History  . Occupation: disability  Tobacco Use  . Smoking status: Never Smoker  . Smokeless tobacco: Never Used  Vaping Use  . Vaping Use: Never used  Substance  and Sexual Activity  . Alcohol use: Yes    Comment: rare  . Drug use: Never  . Sexual activity: Yes    Partners: Male    Birth control/protection: Post-menopausal  Other Topics Concern  . Not on file  Social History Narrative  . Not on file   Social Determinants of Health   Financial Resource Strain:   . Difficulty of Paying Living Expenses:   Food Insecurity:   . Worried About Programme researcher, broadcasting/film/video in the Last Year:   . Barista in the Last Year:   Transportation Needs:   . Freight forwarder (Medical):   Marland Kitchen Lack of Transportation (Non-Medical):   Physical Activity:   . Days of Exercise per Week:   . Minutes of Exercise per  Session:   Stress:   . Feeling of Stress :   Social Connections:   . Frequency of Communication with Friends and Family:   . Frequency of Social Gatherings with Friends and Family:   . Attends Religious Services:   . Active Member of Clubs or Organizations:   . Attends Banker Meetings:   Marland Kitchen Marital Status:    Additional Social History:  Lives alone --son says she has been bed ridden last few weeks--- she cannot ambulate Son cannot manage her care.  She has not been able to ambulate and has been bedridden  She has medicare and medicaid and needs NH placement after gero psych admission  PT and OT consult pending for assessment   --    Allergies:   Allergies  Allergen Reactions  . Lithium Other (See Comments)    Tremors, nausea Tremors, nausea    Labs:  Results for orders placed or performed during the hospital encounter of 07/28/19 (from the past 48 hour(s))  CBC     Status: Abnormal   Collection Time: 07/28/19 11:52 AM  Result Value Ref Range   WBC 10.9 (H) 4.0 - 10.5 K/uL   RBC 5.41 (H) 3.87 - 5.11 MIL/uL   Hemoglobin 14.9 12.0 - 15.0 g/dL   HCT 95.2 36 - 46 %   MCV 80.4 80.0 - 100.0 fL   MCH 27.5 26.0 - 34.0 pg   MCHC 34.3 30.0 - 36.0 g/dL   RDW 84.1 32.4 - 40.1 %   Platelets 457 (H) 150 - 400 K/uL   nRBC 0.0 0.0 - 0.2 %    Comment: Performed at [facility], [address] [place], [place] [address]  Comprehensive metabolic panel     Status: Abnormal   Collection Time: 07/28/19 11:52 AM  Result Value Ref Range   Sodium 127 (L) 135 - 145 mmol/L   Potassium 5.0 3.5 - 5.1 mmol/L    Comment: HEMOLYSIS AT THIS LEVEL MAY AFFECT RESULT   Chloride 96 (L) 98 - 111 mmol/L   CO2 12 (L) 22 - 32 mmol/L   Glucose, Bld 111 (H) 70 - 99 mg/dL    Comment: Glucose reference range applies only to samples taken after fasting for at least 8 hours.   BUN 16 8 - 23 mg/dL   Creatinine, Ser 3.66 (H) 0.44 - 1.00 mg/dL   Calcium 9.5 8.9 - 44.0 mg/dL   Total  Protein 7.9 6.5 - 8.1 g/dL   Albumin 4.7 3.5 - 5.0 g/dL   AST 36 15 - 41 U/L    Comment: HEMOLYSIS AT THIS LEVEL MAY AFFECT RESULT   ALT 38 0 - 44 U/L   Alkaline  Phosphatase 94 38 - 126 U/L   Total Bilirubin 1.7 (H) 0.3 - 1.2 mg/dL    Comment: HEMOLYSIS AT THIS LEVEL MAY AFFECT RESULT   GFR calc non Af Amer 50 (L) >60 mL/min   GFR calc Af Amer 58 (L) >60 mL/min   Anion gap 19 (H) 5 - 15    Comment: Performed at [facility], [address] [place], [place] [address]  Ammonia     Status: None   Collection Time: 07/28/19 11:52 AM  Result Value Ref Range   Ammonia 15 9 - 35 umol/L    Comment: HEMOLYSIS AT THIS LEVEL MAY AFFECT RESULT Performed at [facility], [address] [place], [place] [address]   Acetaminophen level     Status: Abnormal   Collection Time: 07/28/19 11:54 AM  Result Value Ref Range   Acetaminophen (Tylenol), Serum <10 (L) 10 - 30 ug/mL    Comment: (NOTE) Therapeutic concentrations vary significantly. A range of 10-30 ug/mL  may be an effective concentration for many patients. However, some  are best treated at concentrations outside of this range. Acetaminophen concentrations >150 ug/mL at 4 hours after ingestion  and >50 ug/mL at 12 hours after ingestion are often associated with  toxic reactions.  Performed at [facility], [address] [place], [place] [address]   Ethanol     Status: None   Collection Time: 07/28/19 11:54 AM  Result Value Ref Range   Alcohol, Ethyl (B) <10 <10 mg/dL    Comment: (NOTE) Lowest detectable limit for serum alcohol is 10 mg/dL.  For medical purposes only. Performed at [facility], [address] [place], [place] [address]   Salicylate level     Status: Abnormal   Collection Time: 07/28/19 11:54 AM  Result Value Ref Range   Salicylate Lvl <7.0 (L) 7.0 - 30.0 mg/dL    Comment: Performed at [facility], [address] [place], [place] [address]  TSH     Status:  None   Collection Time: 07/28/19 11:54 AM  Result Value Ref Range   TSH 2.667 0.350 - 4.500 uIU/mL    Comment: Performed by a 3rd Generation assay with a functional sensitivity of <=0.01 uIU/mL. Performed at [facility], [address] [place], [place] [address]   SARS Coronavirus 2 by RT PCR (hospital order, performed in [facility] hospital lab) Nasopharyngeal Nasopharyngeal Swab     Status: None   Collection Time: 07/28/19  1:42 PM   Specimen: Nasopharyngeal Swab  Result Value Ref Range   SARS Coronavirus 2 NEGATIVE NEGATIVE    Comment: (NOTE) SARS-CoV-2 target nucleic acids are NOT DETECTED.  The SARS-CoV-2 RNA is generally detectable in upper and lower respiratory specimens during the acute phase of infection. The lowest concentration of SARS-CoV-2 viral copies this assay can detect is 250 copies / mL. A negative result does not preclude SARS-CoV-2 infection and should not be used as the sole basis for treatment or other patient management decisions.  A negative result may occur with improper specimen collection / handling, submission of specimen other than nasopharyngeal swab, presence of viral mutation(s) within the areas targeted by this assay, and inadequate number of viral copies (<250 copies / mL). A negative result must be combined with clinical observations, patient history, and epidemiological information.  Fact Sheet for Patients:   BoilerBrush.com.cy  Fact Sheet for Healthcare Providers: https://pope.com/  This test is not yet approved or  cleared by the [place]  FDA and has been authorized for detection and/or diagnosis of SARS-CoV-2 by FDA under an Emergency Use Authorization (EUA).  This EUA will remain in effect (meaning this test can be used) for the duration of the COVID-19 declaration under Section 564(b)(1) of the Act, 21 U.S.C. section 360bbb-3(b)(1), unless the authorization is terminated  or revoked sooner.  Performed at [facility], [address] [place], [place] [id number]     Current Facility-Administered Medications  Medication Dose Route Frequency Provider Last Rate Last Admin  . 0.9 %  sodium chloride infusion   Intravenous Continuous Delfino LovettShah, Vipul, MD      . Amantadine HCl 150 mg  150 mg Oral BID Delfino LovettShah, Vipul, MD      . ascorbic acid (VITAMIN C) tablet 500 mg  500 mg Oral Daily Sherryll BurgerShah, Vipul, MD      . atorvastatin (LIPITOR) tablet 10 mg  10 mg Oral QPM Sherryll BurgerShah, Vipul, MD      . azelastine (ASTELIN) 0.1 % nasal spray 2 spray  2 spray Each Nare Daily PRN Delfino LovettShah, Vipul, MD      . benztropine (COGENTIN) tablet 1 mg  1 mg Oral BID Delfino LovettShah, Vipul, MD      . bisacodyl (DULCOLAX) EC tablet 5 mg  5 mg Oral Daily PRN Delfino LovettShah, Vipul, MD      . cholecalciferol (VITAMIN D3) tablet 1,000 Units  1,000 Units Oral Daily Sherryll BurgerShah, Vipul, MD      . citalopram (CELEXA) tablet 30 mg  30 mg Oral Daily Sherryll BurgerShah, Vipul, MD      . clonazePAM (KLONOPIN) tablet 1 mg  1 mg Oral BID Delfino LovettShah, Vipul, MD      . cloNIDine (CATAPRES - Dosed in mg/24 hr) patch 0.3 mg  0.3 mg Transdermal Weekly Sherryll BurgerShah, Vipul, MD      . docusate sodium (COLACE) capsule 100 mg  100 mg Oral BID Delfino LovettShah, Vipul, MD      . doxepin (SINEQUAN) capsule 300 mg  300 mg Oral QHS Sherryll BurgerShah, Vipul, MD      . DULoxetine (CYMBALTA) DR capsule 30 mg  30 mg Oral Daily Sherryll BurgerShah, Vipul, MD      . fluticasone (FLONASE) 50 MCG/ACT nasal spray 1 spray  1 spray Each Nare Daily PRN Sherryll BurgerShah, Vipul, MD      . fluticasone furoate-vilanterol (BREO ELLIPTA) 200-25 MCG/INH 1 puff  1 puff Inhalation Daily Sherryll BurgerShah, Vipul, MD      . gabapentin (NEURONTIN) tablet 600 mg  600 mg Oral TID Delfino LovettShah, Vipul, MD      . glycopyrrolate (ROBINUL) tablet 2 mg  2 mg Oral TID Delfino LovettShah, Vipul, MD      . hydrOXYzine (VISTARIL) capsule 50-100 mg  50-100 mg Oral QHS PRN Delfino LovettShah, Vipul, MD      . liraglutide (VICTOZA) SOPN 1.8 mg  1.8 mg Subcutaneous Daily Sherryll BurgerShah, Vipul, MD      . LORazepam (ATIVAN) tablet 1 mg   1 mg Oral Q6H PRN Roselind Messierao, Kirah Stice, MD      . losartan (COZAAR) tablet 100 mg  100 mg Oral Daily Sherryll BurgerShah, Vipul, MD      . metFORMIN (GLUCOPHAGE) tablet 1,000 mg  1,000 mg Oral BID WC Sherryll BurgerShah, Vipul, MD      . ondansetron (ZOFRAN) tablet 4 mg  4 mg Oral Q6H PRN Delfino LovettShah, Vipul, MD       Or  . ondansetron (ZOFRAN) injection 4 mg  4 mg Intravenous Q6H PRN Delfino LovettShah, Vipul, MD      . pantoprazole (PROTONIX)  EC tablet 40 mg  40 mg Oral Daily Delfino Lovett, MD      . propranolol (INDERAL) tablet 80 mg  80 mg Oral BID Delfino Lovett, MD      . rivaroxaban Carlena Hurl) tablet 20 mg  20 mg Oral Q supper Delfino Lovett, MD      . tiotropium [facility]) inhalation capsule ([facility] use ONLY) 18 mcg  18 mcg Inhalation Daily Sherryll Burger, Vipul, MD      . vitamin B-12 (CYANOCOBALAMIN) tablet 1,000 mcg  1,000 mcg Oral Daily Delfino Lovett, MD       Current Outpatient Medications  Medication Sig Dispense Refill  . Amantadine HCl 100 MG tablet Take 150 mg by mouth 2 (two) times daily.    Marland Kitchen ascorbic acid (VITAMIN C) 500 MG tablet Take 500 mg by mouth daily.    Marland Kitchen atorvastatin (LIPITOR) 10 MG tablet Take 10 mg by mouth every evening.    Marland Kitchen azelastine (ASTELIN) 0.1 % nasal spray Place 2 sprays into both nostrils daily as needed for rhinitis or allergies.     . benztropine (COGENTIN) 1 MG tablet Take 1 tablet (1 mg total) by mouth 2 (two) times daily. 60 tablet 0  . cefdinir (OMNICEF) 300 MG capsule Take 1 capsule (300 mg total) by mouth 2 (two) times daily. 4 capsule 0  . cholecalciferol (VITAMIN D3) 25 MCG (1000 UNIT) tablet Take 1,000 Units by mouth daily.    . citalopram (CELEXA) 20 MG tablet Take 30 mg by mouth daily.     . clonazePAM (KLONOPIN) 1 MG tablet Take 1 mg by mouth at bedtime as needed for anxiety.    . cloNIDine (CATAPRES - DOSED IN MG/24 HR) 0.3 mg/24hr patch Place 0.3 mg onto the skin once a week.     . doxepin (SINEQUAN) 100 MG capsule Take 300 mg by mouth at bedtime.    . fluticasone (FLONASE) 50 MCG/ACT nasal spray Place 1 spray  into both nostrils daily as needed for allergies or rhinitis.     . fluticasone furoate-vilanterol (BREO ELLIPTA) 200-25 MCG/INH AEPB Inhale 1 puff into the lungs daily.    Marland Kitchen gabapentin (NEURONTIN) 600 MG tablet Take 600 mg by mouth 3 (three) times daily.    Marland Kitchen glycopyrrolate (ROBINUL) 2 MG tablet Take 2 mg by mouth 3 (three) times daily.     . hydrOXYzine (VISTARIL) 50 MG capsule Take 50-100 mg by mouth at bedtime as needed for anxiety (sleep).     . losartan (COZAAR) 100 MG tablet Take 100 mg by mouth daily.    . metFORMIN (GLUCOPHAGE) 1000 MG tablet Take 1,000 mg by mouth 2 (two) times daily with a meal.    . omeprazole (PRILOSEC) 40 MG capsule Take 40 mg by mouth daily.     . propranolol (INDERAL) 80 MG tablet Take 80 mg by mouth 2 (two) times daily.    . rivaroxaban (XARELTO) 20 MG TABS tablet Take 20 mg by mouth daily with supper.    . sulfamethoxazole-trimethoprim (BACTRIM DS) 800-160 MG tablet Take 1 tablet by mouth 2 (two) times daily. 20 tablet 0  . tiotropium (SPIRIVA) 18 MCG inhalation capsule Place 1 capsule (18 mcg total) into inhaler and inhale daily. 30 capsule 1  . VICTOZA 18 MG/3ML SOPN Inject 1.8 mg into the skin daily.    . vitamin B-12 (CYANOCOBALAMIN) 1000 MCG tablet Take 1,000 mcg by mouth daily.      Musculoskeletal: Strength & Muscle Tone:  Has deconditioning post falls and sequelae Gait &  Station:  Impaired  Patient leans: n/a   Psychiatric Specialty Exam: Physical Exam  Review of Systems  Blood pressure (!) 157/75, pulse 93, temperature 98.2 F (36.8 C), temperature source Oral, resp. rate (!) 22, SpO2 100 %.There is no height or weight on file to calculate BMI.    Mental Status   Anxious caucasian female confused shaking  Rapport fair to poor Eye contact okay Judgement insight reliability impaired She does not meet capacity for consent  She does not understand the nature of her issues and illnesses and the results in general of not being treated Memory  --remote recent and immediate all impaired  She does not fully recall her own birthdate or birth place She is confabulating and giving approximate answers She is vague and a poor historian Many " I do not know answers"-- Speech --rapid pressured Mood and affect anxious elevated Fund  Of knowledge intelligence cognition all poor  Declining  Abstraction --concrete SI and HI --not clear she is too vague Concentration and attention very poor  Consciousness not clouded or fluctuant -- Has shakes and tremors diffusely  Sleep impaired all three cycles Cognition declining over many years Patient cannot manage ADL's at home.                                                           Treatment Plan Summary:   Patient was transferred to medicine due to medical seqelae   Her Psych meds were simplified and trimmed down --in case medication confusion is going on  Hyponatremia could be partially from SSRI and SNRI's --d/ced   Also she has not been regularly eating and drinking at home due to immobilization and no professional supportive care  Patient needs [facility] transfer prior to NH and assisted living   Family needs to obtain power of attorney as patient does not meet capacity for consent  And cannot manage at home ----without being placed into NH AFTER gero psych        Disposition:  Admitted to Medicine  But then needs outside referral to [facility] Psych --do not think she should go to NH directly after this but needs NH after [facility], MD 07/28/2019 5:03 PM

## 2019-07-28 NOTE — H&P (Signed)
[place] at [facility]   PATIENT NAME: Leslie Gould    MR#:  [id number]  DATE OF BIRTH:  May 23, 1950  DATE OF ADMISSION:  07/28/2019  PRIMARY CARE PHYSICIAN: Shane Crutch, PA   REQUESTING/REFERRING PHYSICIAN: Minna Antis, MD  Patient came from home.  Dependent in most ADLs  CHIEF COMPLAINT:   Chief Complaint  Patient presents with  . Altered Mental Status    HISTORY OF PRESENT ILLNESS:  Leslie Gould  is a 69 y.o. female with a known history of anxiety, depression, GERD, fibromyalgia, diabetes, hypertension was recently hospitalized from 07/18/2019-07/20/2019 due to frequent falls in the setting of tremor.  She is being readmitted for hyponatremia, poor p.o. intake, generalized anxiety disorder and unable to be managed at home per son.  Per son, gotten more weaker since last D/C, lost the appetite. Remains confined to one room. Can't stand even with walker. She knows what to say but can't express. C/o anxiety all week but unable to manage her home.  She is always required extensive medical care all her life per her son. Son would like her to be placed as she is not able to stay independent.  PAST MEDICAL HISTORY:   Past Medical History:  Diagnosis Date  . Allergy   . Anxiety   . Asthma   . Chronic fatigue   . Clotting disorder (HCC)   . Depression   . Diabetes mellitus without complication (HCC)   . Fibromyalgia   . GERD (gastroesophageal reflux disease)   . Hypertension    PAST SURGICAL HISTORY:   Past Surgical History:  Procedure Laterality Date  . APPENDECTOMY    . AUGMENTATION MAMMAPLASTY Bilateral 1980  . CESAREAN SECTION    . TONSILLECTOMY    . TUBAL LIGATION     SOCIAL HISTORY:   Social History   Tobacco Use  . Smoking status: Never Smoker  . Smokeless tobacco: Never Used  Substance Use Topics  . Alcohol use: Yes    Comment: rare   FAMILY HISTORY:   Family History  Problem Relation Age of Onset  . Anxiety disorder Mother    . Heart disease Father 68  . Anxiety disorder Daughter   . Breast cancer Paternal Grandmother 54  . Breast cancer Cousin   . Colon cancer Neg Hx   . Ovarian cancer Neg Hx    DRUG ALLERGIES:   Allergies  Allergen Reactions  . Lithium Other (See Comments)    Tremors, nausea Tremors, nausea   REVIEW OF SYSTEMS:  Review of Systems  Constitutional: Negative for diaphoresis, fever, malaise/fatigue and weight loss.  HENT: Negative for ear discharge, ear pain, hearing loss, nosebleeds, sore throat and tinnitus.   Eyes: Negative for blurred vision and pain.  Respiratory: Negative for cough, hemoptysis, shortness of breath and wheezing.   Cardiovascular: Negative for chest pain, palpitations, orthopnea and leg swelling.  Gastrointestinal: Negative for abdominal pain, blood in stool, constipation, diarrhea, heartburn, nausea and vomiting.  Genitourinary: Negative for dysuria, frequency and urgency.  Musculoskeletal: Negative for back pain and myalgias.  Skin: Negative for itching and rash.  Neurological: Negative for dizziness, tingling, tremors, focal weakness, seizures, weakness and headaches.  Psychiatric/Behavioral: Positive for depression. The patient is nervous/anxious.    MEDICATIONS AT HOME:   Prior to Admission medications   Medication Sig Start Date End Date Taking? Authorizing Provider  Amantadine HCl 100 MG tablet Take 150 mg by mouth 2 (two) times daily. 07/04/19   [provider]  ascorbic acid (  VITAMIN C) 500 MG tablet Take 500 mg by mouth daily.    [provider]  atorvastatin (LIPITOR) 10 MG tablet Take 10 mg by mouth every evening. 07/11/19   [provider]  azelastine (ASTELIN) 0.1 % nasal spray Place 2 sprays into both nostrils daily as needed for rhinitis or allergies.     [provider]  benztropine (COGENTIN) 1 MG tablet Take 1 tablet (1 mg total) by mouth 2 (two) times daily. 07/20/19   Pennie BanterGriffith, Kelly A, DO  cefdinir (OMNICEF)  300 MG capsule Take 1 capsule (300 mg total) by mouth 2 (two) times daily. 07/20/19   Pennie BanterGriffith, Kelly A, DO  cholecalciferol (VITAMIN D3) 25 MCG (1000 UNIT) tablet Take 1,000 Units by mouth daily.    [provider]  citalopram (CELEXA) 20 MG tablet Take 20-30 mg by mouth See admin instructions. Take 1 tablet (20mg ) by mouth daily for 7 days then take 1 tablets (30mg ) daily thereafter 07/13/19 07/27/19  [provider]  clonazePAM (KLONOPIN) 1 MG tablet Take 1 mg by mouth at bedtime as needed for anxiety.    [provider]  cloNIDine (CATAPRES - DOSED IN MG/24 HR) 0.3 mg/24hr patch Place 0.3 mg onto the skin once a week.     [provider]  doxepin (SINEQUAN) 100 MG capsule Take 300 mg by mouth at bedtime. 07/17/19   [provider]  DULoxetine (CYMBALTA) 30 MG capsule Take 30-60 mg by mouth See admin instructions. Take 2 capsules (60mg ) by mouth daily for 7 days then take 1 capsule (30mg ) by mouth daily for 7 days then stop 07/13/19 07/27/19  [provider]  fluticasone (FLONASE) 50 MCG/ACT nasal spray Place 1 spray into both nostrils daily as needed for allergies or rhinitis.     [provider]  fluticasone furoate-vilanterol (BREO ELLIPTA) 200-25 MCG/INH AEPB Inhale 1 puff into the lungs daily.    [provider]  gabapentin (NEURONTIN) 600 MG tablet Take 600 mg by mouth 3 (three) times daily.    [provider]  glycopyrrolate (ROBINUL) 2 MG tablet Take 2 mg by mouth 3 (three) times daily.     [provider]  hydrOXYzine (VISTARIL) 50 MG capsule Take 50-100 mg by mouth at bedtime as needed for anxiety (sleep).     [provider]  losartan (COZAAR) 100 MG tablet Take 100 mg by mouth daily. 06/30/19   [provider]  metFORMIN (GLUCOPHAGE) 1000 MG tablet Take 1,000 mg by mouth 2 (two) times daily with a meal.    [provider]  omeprazole (PRILOSEC) 40 MG capsule Take 40 mg by mouth  daily.     [provider]  propranolol (INDERAL) 80 MG tablet Take 80 mg by mouth 2 (two) times daily.    [provider]  rivaroxaban (XARELTO) 20 MG TABS tablet Take 20 mg by mouth daily with supper.    [provider]  sulfamethoxazole-trimethoprim (BACTRIM DS) 800-160 MG tablet Take 1 tablet by mouth 2 (two) times daily. 07/23/19   Enid DerryWagner, Ashley, PA-C  tiotropium (SPIRIVA) 18 MCG inhalation capsule Place 1 capsule (18 mcg total) into inhaler and inhale daily. 07/20/19   [place]aw GrandchildGriffith, Kelly A, DO  VICTOZA 18 MG/3ML SOPN Inject 1.8 mg into the skin daily. 06/22/19   [provider]  vitamin B-12 (CYANOCOBALAMIN) 1000 MCG tablet Take 1,000 mcg by mouth daily.    [provider]    VITAL SIGNS:  Blood pressure (!) 157/105, pulse 80,  temperature 98.2 F (36.8 C), temperature source Oral, resp. rate (!) 34, SpO2 100 %. PHYSICAL EXAMINATION:  Physical Exam  GENERAL:  69 y.o.-year-old patient lying in the bed with no acute distress.  EYES: Pupils equal, round, reactive to light and accommodation. No scleral icterus. Extraocular muscles intact.  HEENT: Head atraumatic, normocephalic. Oropharynx and nasopharynx clear.  NECK:  Supple, no jugular venous distention. No thyroid enlargement, no tenderness.  LUNGS: Normal breath sounds bilaterally, no wheezing, rales,rhonchi or crepitation. No use of accessory muscles of respiration.  CARDIOVASCULAR: S1, S2 normal. No murmurs, rubs, or gallops.  ABDOMEN: Soft, nontender, nondistended. Bowel sounds present. No organomegaly or mass.  EXTREMITIES: No pedal edema, cyanosis, or clubbing.  NEUROLOGIC: Cranial nerves II through XII are intact. Muscle strength 5/5 in all extremities. Sensation intact. Gait not checked.  PSYCHIATRIC: The patient is alert and seems very anxious. SKIN: No obvious rash, lesion, or ulcer.  LABORATORY PANEL:   CBC Recent Labs  Lab 07/28/19 1152  WBC 10.9*  HGB 14.9  HCT 43.5  PLT 457*    ------------------------------------------------------------------------------------------------------------------  Chemistries  Recent Labs  Lab 07/28/19 1152  NA 127*  K 5.0  CL 96*  CO2 12*  GLUCOSE 111*  BUN 16  CREATININE 1.12*  CALCIUM 9.5  AST 36  ALT 38  ALKPHOS 94  BILITOT 1.7*   ------------------------------------------------------------------------------------------------------------------  Cardiac Enzymes No results for input(s): TROPONINI in the last 168 hours. ------------------------------------------------------------------------------------------------------------------  RADIOLOGY:  CT Head Wo Contrast  Result Date: 07/28/2019 CLINICAL DATA:  Altered mental status (AMS), unclear cause. EXAM: CT HEAD WITHOUT CONTRAST TECHNIQUE: Contiguous axial images were obtained from the base of the skull through the vertex without intravenous contrast. COMPARISON:  Prior head CT examinations 07/18/2019 and earlier FINDINGS: Brain: Stable, mild generalized parenchymal atrophy. Mild patchy and ill-defined hypoattenuation within the cerebral white matter is nonspecific, but consistent with chronic small vessel ischemic disease. There is no acute intracranial hemorrhage. No demarcated cortical infarct is identified. No extra-axial fluid collection. No evidence of intracranial mass. No midline shift. Vascular: No hyperdense vessel. Skull: Normal. Negative for fracture or focal lesion. Sinuses/Orbits: Visualized orbits show no acute finding. No significant paranasal sinus disease or mastoid effusion at the imaged levels. IMPRESSION: No CT evidence of acute intracranial abnormality. Stable, mild generalized parenchymal atrophy and chronic small vessel ischemic disease. Electronically Signed   By: Jackey Loge DO   On: 07/28/2019 12:30   DG Chest Portable 1 View  Result Date: 07/28/2019 CLINICAL DATA:  Altered mental status. EXAM: PORTABLE CHEST 1 VIEW COMPARISON:  Portable chest  06/19/2018. FINDINGS: Mediastinum hilar structures normal. Heart size normal. No focal infiltrate. No pleural effusion or pneumothorax. Breast implants noted. Right shoulder replacement. IMPRESSION: No acute cardiopulmonary disease. Electronically Signed   By: Maisie Fus  Register   On: 07/28/2019 12:51   IMPRESSION AND PLAN:  69 year old female anxiety, depression, GERD, fibromyalgia, diabetes, hypertension was recently hospitalized from 07/18/2019-07/20/2019 due to frequent falls is being admitted for failure to thrive, hyponatremia, severe anxiety spells and family unable to manage at home  Anxiety/depression -Continue home medications for now, consult psychiatry Case discussed with Dr. Smith Robert  Hyponatremia Her baseline sodium is around 130, it is 127 now we will hydrate her with IV fluids and monitor sodium  Acute kidney injury Likely prerenal from poor p.o. intake and dehydration, start IV hydration and monitor kidney function  Failure to thrive, generalized weakness Multifactorial the biggest issue is her mental health.  She is followed by [facility]  by Dr. Luanna Salk every 3 weeks. Per her son patient always had this issue all her life and has required extensive care We will get PT/OT evaluation and ask TOC to evaluate for placement    All the records are reviewed and case discussed with ED provider. Management plans discussed with the patient, family (discussed with son over phone) and they are in agreement.  CODE STATUS: Full code  TOTAL TIME TAKING CARE OF THIS PATIENT: 45 minutes.    Delfino Lovett M.D on 07/28/2019 at 3:17 PM  [facility]   CC: Primary care physician; Shane Crutch, PA   Note: This dictation was prepared with Dragon dictation along with smaller phrase technology. Any transcriptional errors that result from this process are unintentional.

## 2019-07-28 NOTE — Progress Notes (Signed)
Patient request to see chaplin. Charge nurse contacts chaplin who is currently at bedside with patient.

## 2019-07-29 DIAGNOSIS — N179 Acute kidney failure, unspecified: Principal | ICD-10-CM

## 2019-07-29 DIAGNOSIS — R531 Weakness: Secondary | ICD-10-CM

## 2019-07-29 DIAGNOSIS — E871 Hypo-osmolality and hyponatremia: Secondary | ICD-10-CM

## 2019-07-29 LAB — BASIC METABOLIC PANEL
Anion gap: 10 (ref 5–15)
BUN: 14 mg/dL (ref 8–23)
CO2: 18 mmol/L — ABNORMAL LOW (ref 22–32)
Calcium: 8.9 mg/dL (ref 8.9–10.3)
Chloride: 104 mmol/L (ref 98–111)
Creatinine, Ser: 0.93 mg/dL (ref 0.44–1.00)
GFR calc Af Amer: >60 mL/min (ref >60)
GFR calc non Af Amer: >60 mL/min (ref >60)
Glucose, Bld: 108 mg/dL — ABNORMAL HIGH (ref 70–99)
Potassium: 3.8 mmol/L (ref 3.5–5.1)
Sodium: 132 mmol/L — ABNORMAL LOW (ref 135–145)

## 2019-07-29 LAB — GLUCOSE, CAPILLARY
Glucose-Capillary: 59 mg/dL — ABNORMAL LOW (ref 70–99)
Glucose-Capillary: 68 mg/dL — ABNORMAL LOW (ref 70–99)
Glucose-Capillary: 78 mg/dL (ref 70–99)
Glucose-Capillary: 86 mg/dL (ref 70–99)
Glucose-Capillary: 88 mg/dL (ref 70–99)
Glucose-Capillary: 99 mg/dL (ref 70–99)

## 2019-07-29 LAB — CBC
HCT: 38.9 % (ref 36.0–46.0)
Hemoglobin: 13.4 g/dL (ref 12.0–15.0)
MCH: 28 pg (ref 26.0–34.0)
MCHC: 34.4 g/dL (ref 30.0–36.0)
MCV: 81.2 fL (ref 80.0–100.0)
Platelets: 370 10*3/uL (ref 150–400)
RBC: 4.79 MIL/uL (ref 3.87–5.11)
RDW: 15 % (ref 11.5–15.5)
WBC: 8.9 10*3/uL (ref 4.0–10.5)
nRBC: 0 % (ref 0.0–0.2)

## 2019-07-29 MED ORDER — INSULIN ASPART 100 UNIT/ML ~~LOC~~ SOLN: 0.0000 [IU] | SUBCUTANEOUS | Status: DC

## 2019-07-29 NOTE — Plan of Care (Signed)
  Problem: Education: Goal: Knowledge of General Education information will improve Description: Including pain rating scale, medication(s)/side effects and non-pharmacologic comfort measures Outcome: Not Progressing   Problem: Coping: Goal: Level of anxiety will decrease Outcome: Not Progressing   Problem: Coping: Goal: Level of anxiety will decrease Outcome: Not Progressing

## 2019-07-29 NOTE — Progress Notes (Signed)
OT Cancellation Note  Patient Details Name: Leslie Gould MRN: [id number] DOB: [birth year]   Cancelled Treatment:    Reason Eval/Treat Not Completed: Fatigue/lethargy limiting ability to participate. OT order received and chart reviewed. Per conversation c RN, pt near baseline for ADLs/ fine motor. Donned shorts Independently at bed level c PT this date. Pt does not have skilled acute OT needs at this time. Will sign off. Please re-consult if new needs arise.   Kathie Dike, M.S. OTR/L  07/29/19, 2:37 PM

## 2019-07-29 NOTE — Evaluation (Signed)
Physical Therapy Evaluation Patient Details Name: Leslie Gould MRN: [id number] DOB: 27-Apr-1950 Today's Date: 07/29/2019   History of Present Illness  Leslie Gould  is a 69 y.o. female with a known history of anxiety, depression, GERD, fibromyalgia, diabetes, hypertension was recently hospitalized from 07/18/2019-07/20/2019 due to frequent falls in the setting of tremor.  She is being readmitted for hyponatremia, poor p.o. intake, generalized anxiety disorder and unable to be managed at home per son.Per son, gotten more weaker since last D/C, lost the appetite. Remains confined to one room. Can't stand even with walker.  Clinical Impression  Pt is a pleasant though mildly confused and anxious 69 year old F who was admitted for hyponatremia and frequent falls.  Pt able to answer orientation questions correctly, though insists on calling son frequently throughout session and at end of session states "someone took my shorts without asking," and insists on putting them back on. O2 saturation initially low (88%) when encountered supine in bed; improves in sitting to 95%, and decreasing again with initial STS attempt to 78% (though pt asymptompatic); O2 sat improving with brief seated rest break and instruction in pursed lip breathing, and remains >92% throughout remaining session for ambulation, etc. Pt performs bed mobility with mod I including supine<>sit, though requiring CGA initially upon achieving sitting, though able to self-stabilize. Pt transfers with CGA and RW including STS, demonstrating mild unsteadiness once standing and bilateral UE tremors. Pt ambulates 30' in-room with RW requiring CGA due to unsteadiness, monitoring O2 sat throughout, demonstrating slow, steady steps. Once returned to bed, pt able to bridge and put on shorts independently. Pt demonstrates deficits with activity tolerance, balance, and tremors requiring continued skilled PT intervention to improve functional mobility.      Follow Up Recommendations Home health PT;Supervision for mobility/OOB    Equipment Recommendations  Rolling walker with 5" wheels    Recommendations for Other Services       Precautions / Restrictions Precautions Precautions: Fall Restrictions Weight Bearing Restrictions: No      Mobility  Bed Mobility Overal bed mobility: Modified Independent             General bed mobility comments: supine<>sit with mod I for extra time; able to perform bridging independently to put on shorts in hooklying  Transfers Overall transfer level: Needs assistance Equipment used: Rolling walker (2 wheeled) Transfers: Sit to/from Stand Sit to Stand: Min guard         General transfer comment: mildly unsteady once standing, tremors in bilateral UEs; monitoring O2 throughout  Ambulation/Gait Ambulation/Gait assistance: Min guard Gait Distance (Feet): 30 Feet Assistive device: Rolling walker (2 wheeled) Gait Pattern/deviations: Step-through pattern;Decreased step length - right;Decreased step length - left     General Gait Details: Heavy reliance on RW, with bil UE tremors; slow, small steps  Stairs            Wheelchair Mobility    Modified Rankin (Stroke Patients Only)       Balance Overall balance assessment: Needs assistance Sitting-balance support: No upper extremity supported Sitting balance-Leahy Scale: Fair Sitting balance - Comments: Initially unsteady upon achieving sitting from supine, requiring CGA; however able to stabilize and sit independently; O2 improving to 95 once sitting upright (from 88% supine)   Standing balance support: Bilateral upper extremity supported;During functional activity Standing balance-Leahy Scale: Poor Standing balance comment: UE tremors bilaterally, heavy reliance on RW in standing  Pertinent Vitals/Pain Pain Assessment: No/denies pain Pain Intervention(s): Monitored during session     Home Living Family/patient expects to be discharged to:: Other (Comment) (no family present; pt states they're considering "intensive rehab," but is amenable to going home at end of session) Living Arrangements: Alone Available Help at Discharge: Family;Available PRN/intermittently (son) Type of Home: Apartment Home Access: Ramped entrance     Home Layout: One level Home Equipment: Bedside commode;Walker - 2 wheels      Prior Function Level of Independence: Independent with assistive device(s)         Comments: Reporting frequent falls ~10-12, and states she feels an "aura" prior to falling indicating impending fall. Has been limiting ambulation to household distances in the last couple weeks due to weakness/fatigue.     Hand Dominance        Extremity/Trunk Assessment   Upper Extremity Assessment Upper Extremity Assessment: Overall WFL for tasks assessed    Lower Extremity Assessment Lower Extremity Assessment: Overall WFL for tasks assessed       Communication   Communication: No difficulties  Cognition Arousal/Alertness: Awake/alert Behavior During Therapy: WFL for tasks assessed/performed Overall Cognitive Status: Within Functional Limits for tasks assessed                                 General Comments: A&O x4; however insisting on calling son multiple times during session, and becoming mildly agitated at end of session stating her shorts were taken off of her without permission and she would like them back.      General Comments      Exercises     Assessment/Plan    PT Assessment Patient needs continued PT services  PT Problem List Decreased strength;Decreased activity tolerance;Decreased balance;Decreased mobility       PT Treatment Interventions DME instruction;Gait training;Functional mobility training;Therapeutic activities;Therapeutic exercise;Balance training;Patient/family education    PT Goals (Current goals can be found  in the Care Plan section)  Acute Rehab PT Goals Patient Stated Goal: Improved balance PT Goal Formulation: With patient Time For Goal Achievement: 08/12/19 Potential to Achieve Goals: Good    Frequency Min 2X/week   Barriers to discharge Decreased caregiver support      Co-evaluation               AM-PAC PT "6 Clicks" Mobility  Outcome Measure Help needed turning from your back to your side while in a flat bed without using bedrails?: None Help needed moving from lying on your back to sitting on the side of a flat bed without using bedrails?: A Little Help needed moving to and from a bed to a chair (including a wheelchair)?: A Little Help needed standing up from a chair using your arms (e.g., wheelchair or bedside chair)?: A Little Help needed to walk in hospital room?: A Little Help needed climbing 3-5 steps with a railing? : A Lot 6 Click Score: 18    End of Session Equipment Utilized During Treatment: Gait belt Activity Tolerance: Patient tolerated treatment well Patient left: in bed;with bed alarm set Nurse Communication: Mobility status PT Visit Diagnosis: Muscle weakness (generalized) (M62.81);Difficulty in walking, not elsewhere classified (R26.2);Repeated falls (R29.6);Unsteadiness on feet (R26.81)    Time: 2694-8546 PT Time Calculation (min) (ACUTE ONLY): 30 min   Charges:   PT Evaluation $PT Eval Moderate Complexity: 1 Mod          Kendal Hymen, PT, DPT 07/29/19, 10:35 AM

## 2019-07-29 NOTE — Progress Notes (Signed)
PROGRESS NOTE    Leslie Gould  DQQ:[id number] DOB: 1950-03-17 DOA: 07/28/2019 PCP: Shane Crutch, PA    Brief Narrative:  Leslie Gould  is a 69 y.o. female with a known history of anxiety, depression, GERD, fibromyalgia, diabetes, hypertension was recently hospitalized from 07/18/2019-07/20/2019 due to frequent falls in the setting of tremor.  She is being readmitted for hyponatremia, poor p.o. intake, generalized anxiety disorder and unable to be managed at home per son.  Per son, gotten more weaker since last D/C, lost the appetite. Remains confined to one room. Can't stand even with walker. She knows what to say but can't express. C/o anxiety all week but unable to manage her home.  She is always required extensive medical care all her life per her son. Son would like her to be placed as she is not able to stay independent.     Consultants:   Psych  Procedures:   Antimicrobials:       Subjective: Has no complaints this AM.  Wants to know why she was started on Lipitor with by her primary care and requesting to have fasting lipid panel was done on her.  Otherwise no other complaints  Objective: Vitals:   07/28/19 2009 07/29/19 0500 07/29/19 0733 07/29/19 1008  BP: (!) 139/94  (!) 156/88   Pulse: 95  68 77  Resp: 20  18   Temp: 97.9 F (36.6 C)  97.8 F (36.6 C)   TempSrc: Oral  Oral   SpO2: 97%  100% (!) 88%  Weight:  98.4 kg      Intake/Output Summary (Last 24 hours) at 07/29/2019 1419 Last data filed at 07/29/2019 0600 Gross per 24 hour  Intake 919.11 ml  Output 0 ml  Net 919.11 ml   Filed Weights   07/29/19 0500  Weight: 98.4 kg    Examination:  General exam: Appears calm and comfortable, NAD Respiratory system: Clear to auscultation. Respiratory effort normal. Cardiovascular system: S1 & S2 heard, RRR. No JVD, murmurs, rubs, gallops or clicks.  Gastrointestinal system: Abdomen is nondistended, soft and nontender. Normal bowel sounds heard. Central  nervous system: Alert and oriented.  Grossly intact Extremities: No edema Skin: Warm dry Psychiatry:  Mood & affect appropriate in current setting.  Interactive    Data Reviewed: I have personally reviewed following labs and imaging studies  CBC: Recent Labs  Lab 07/23/19 1022 07/28/19 1152 07/29/19 0455  WBC 8.4 10.9* 8.9  HGB 12.7 14.9 13.4  HCT 38.3 43.5 38.9  MCV 82.0 80.4 81.2  PLT 394 457* 370   Basic Metabolic Panel: Recent Labs  Lab 07/23/19 1022 07/28/19 1152 07/29/19 0455  NA 132* 127* 132*  K 4.4 5.0 3.8  CL 95* 96* 104  CO2 27 12* 18*  GLUCOSE 132* 111* 108*  BUN 25* 16 14  CREATININE 1.08* 1.12* 0.93  CALCIUM 9.2 9.5 8.9   GFR: Estimated Creatinine Clearance: 59.3 mL/min (by C-G formula based on SCr of 0.93 mg/dL). Liver Function Tests: Recent Labs  Lab 07/28/19 1152  AST 36  ALT 38  ALKPHOS 94  BILITOT 1.7*  PROT 7.9  ALBUMIN 4.7   No results for input(s): LIPASE, AMYLASE in the last 168 hours. Recent Labs  Lab 07/28/19 1152  AMMONIA 15   Coagulation Profile: No results for input(s): INR, PROTIME in the last 168 hours. Cardiac Enzymes: No results for input(s): CKTOTAL, CKMB, CKMBINDEX, TROPONINI in the last 168 hours. BNP (last 3 results) No results for input(s): PROBNP in the  last 8760 hours. HbA1C: No results for input(s): HGBA1C in the last 72 hours. CBG: Recent Labs  Lab 07/29/19 0737  GLUCAP 88   Lipid Profile: No results for input(s): CHOL, HDL, LDLCALC, TRIG, CHOLHDL, LDLDIRECT in the last 72 hours. Thyroid Function Tests: Recent Labs    07/28/19 1154  TSH 2.667   Anemia Panel: No results for input(s): VITAMINB12, FOLATE, FERRITIN, TIBC, IRON, RETICCTPCT in the last 72 hours. Sepsis Labs: No results for input(s): PROCALCITON, LATICACIDVEN in the last 168 hours.  Recent Results (from the past 240 hour(s))  SARS Coronavirus 2 by RT PCR (hospital order, performed in [facility] hospital lab) Nasopharyngeal  Nasopharyngeal Swab     Status: None   Collection Time: 07/28/19  1:42 PM   Specimen: Nasopharyngeal Swab  Result Value Ref Range Status   SARS Coronavirus 2 NEGATIVE NEGATIVE Final    Comment: (NOTE) SARS-CoV-2 target nucleic acids are NOT DETECTED.  The SARS-CoV-2 RNA is generally detectable in upper and lower respiratory specimens during the acute phase of infection. The lowest concentration of SARS-CoV-2 viral copies this assay can detect is 250 copies / mL. A negative result does not preclude SARS-CoV-2 infection and should not be used as the sole basis for treatment or other patient management decisions.  A negative result may occur with improper specimen collection / handling, submission of specimen other than nasopharyngeal swab, presence of viral mutation(s) within the areas targeted by this assay, and inadequate number of viral copies (<250 copies / mL). A negative result must be combined with clinical observations, patient history, and epidemiological information.  Fact Sheet for Patients:   BoilerBrush.com.cy  Fact Sheet for Healthcare Providers: https://pope.com/  This test is not yet approved or  cleared by the [place] FDA and has been authorized for detection and/or diagnosis of SARS-CoV-2 by FDA under an Emergency Use Authorization (EUA).  This EUA will remain in effect (meaning this test can be used) for the duration of the COVID-19 declaration under Section 564(b)(1) of the Act, 21 U.S.C. section 360bbb-3(b)(1), unless the authorization is terminated or revoked sooner.  Performed at [facility], [address], [place], [place] [address]          Radiology Studies: CT Head Wo Contrast  Result Date: 07/28/2019 CLINICAL DATA:  Altered mental status (AMS), unclear cause. EXAM: CT HEAD WITHOUT CONTRAST TECHNIQUE: Contiguous axial images were obtained from the base of the skull through the vertex  without intravenous contrast. COMPARISON:  Prior head CT examinations 07/18/2019 and earlier FINDINGS: Brain: Stable, mild generalized parenchymal atrophy. Mild patchy and ill-defined hypoattenuation within the cerebral white matter is nonspecific, but consistent with chronic small vessel ischemic disease. There is no acute intracranial hemorrhage. No demarcated cortical infarct is identified. No extra-axial fluid collection. No evidence of intracranial mass. No midline shift. Vascular: No hyperdense vessel. Skull: Normal. Negative for fracture or focal lesion. Sinuses/Orbits: Visualized orbits show no acute finding. No significant paranasal sinus disease or mastoid effusion at the imaged levels. IMPRESSION: No CT evidence of acute intracranial abnormality. Stable, mild generalized parenchymal atrophy and chronic small vessel ischemic disease. Electronically Signed   By: Jackey Loge DO   On: 07/28/2019 12:30   DG Chest Portable 1 View  Result Date: 07/28/2019 CLINICAL DATA:  Altered mental status. EXAM: PORTABLE CHEST 1 VIEW COMPARISON:  Portable chest 06/19/2018. FINDINGS: Mediastinum hilar structures normal. Heart size normal. No focal infiltrate. No pleural effusion or pneumothorax. Breast implants noted. Right shoulder replacement. IMPRESSION: No acute  cardiopulmonary disease. Electronically Signed   By: Maisie Fus  Register   On: 07/28/2019 12:51        Scheduled Meds:  amantadine  100 mg Oral BID   ascorbic acid  250 mg Oral Daily   atorvastatin  10 mg Oral QPM   cholecalciferol  1,000 Units Oral Daily   clonazePAM  1.5 mg Oral BID   cloNIDine  0.3 mg Transdermal Q Fri   docusate sodium  100 mg Oral BID   doxepin  300 mg Oral QHS   fluticasone furoate-vilanterol  1 puff Inhalation Daily   gabapentin  300 mg Oral TID   glycopyrrolate  2 mg Oral TID   losartan  100 mg Oral Daily   metFORMIN  1,000 mg Oral BID WC   pantoprazole  40 mg Oral Daily   propranolol  80 mg Oral BID    rivaroxaban  20 mg Oral Q supper   tiotropium  18 mcg Inhalation Daily   vitamin B-12  1,000 mcg Oral Daily   Continuous Infusions:  sodium chloride 75 mL/hr at 07/29/19 0600    Assessment & Plan: Patient is a 69 year old female anxiety, depression, GERD, fibromyalgia, diabetes, hypertension was recently hospitalized from 07/18/2019-07/20/2019 due to frequent falls is being admitted for failure to thrive, hyponatremia, severe anxiety spells and family unable to manage at home  Anxiety/depression -Psychiatrist input was appreciated.  Her medications were simplified . SSRIs and SNRIs were discontinued by psych due to possible cause of hyponatremia continue home medications for now Dr. Smith Robert recommended [facility] prior to NH and assisted living - discussed with case mx today , they will check into this. Needs outside referral to [facility] --do not think she should go to NH directly after this but needs NH after [facility]   Hyponatremia Her baseline sodium is around 130, On admission was 127.  Likely due to decrease po intake. Improving with IVF, will continue for now   Acute kidney injury Likely prerenal from poor p.o. intake and dehydration Resolved with IVF Continue to monitor  Avoid nephrotoxin   Failure to thrive, generalized weakness Multifactorial the biggest issue is her mental health.  She is followed by [facility] by Dr. Luanna Salk every 3 weeks. Per her son patient always had this issue all her life and has required extensive care PT/OT Needs [facility] and NH placement after.    DM type 2- on metformin at home, will continue CK HA1c, ck fs, riss Ck FLP per pts requrest   DVT prophylaxis:  Code Status: Family Communication:  Disposition Plan:  Status is: Inpatient  Remains inpatient appropriate because:Unsafe d/c plan   Dispo: The patient is from: Home              Anticipated d/c is to: [facility] psych              Anticipated  d/c date is: 2 days              Patient currently is not medically stable to d/c.unsafe d/c planning            LOS: 1 day   Time spent: 35 minutes with more than 50% on COC    Lynn Ito, MD [facility] Pager [phone]  If 7PM-7AM, please contact night-coverage [email or web address] Password [email or web address] 07/29/2019, 2:19 PM

## 2019-07-30 DIAGNOSIS — E119 Type 2 diabetes mellitus without complications: Secondary | ICD-10-CM

## 2019-07-30 LAB — GLUCOSE, CAPILLARY
Glucose-Capillary: 93 mg/dL (ref 70–99)
Glucose-Capillary: 85 mg/dL (ref 70–99)
Glucose-Capillary: 101 mg/dL — ABNORMAL HIGH (ref 70–99)
Glucose-Capillary: 75 mg/dL (ref 70–99)
Glucose-Capillary: 81 mg/dL (ref 70–99)

## 2019-07-30 MED ORDER — METFORMIN HCL 500 MG PO TABS
500.0000 mg | ORAL_TABLET | Freq: Two times a day (BID) | ORAL | Status: DC
  Administered 2019-07-30 – 2019-07-31 (×2): 500 mg via ORAL
  Filled 2019-07-30 (×2): qty 1

## 2019-07-30 NOTE — Progress Notes (Signed)
PROGRESS NOTE    Leslie Gould  NID:[id number] DOB: [birth year] DOA: 07/28/2019 PCP: Shane Crutch, PA    Brief Narrative:  Leslie Gould  is a 69 y.o. female with a known history of anxiety, depression, GERD, fibromyalgia, diabetes, hypertension was recently hospitalized from 07/18/2019-07/20/2019 due to frequent falls in the setting of tremor.  She is being readmitted for hyponatremia, poor p.o. intake, generalized anxiety disorder and unable to be managed at home per son.  Per son, gotten more weaker since last D/C, lost the appetite. Remains confined to one room. Can't stand even with walker. She knows what to say but can't express. C/o anxiety all week but unable to manage her home.  She is always required extensive medical care all her life per her son. Son would like her to be placed as she is not able to stay independent.     Consultants:   Psych  Procedures:   Antimicrobials:       Subjective: Sleepy this am. Has no complaitns. Had o2 readin 88% on RA,. However now 100% without [place]  Objective: Vitals:   07/29/19 0733 07/29/19 1008 07/29/19 2336 07/30/19 0807  BP: (!) 156/88  138/69 123/76  Pulse: 68 77 70 (!) 58  Resp: 18  17 16   Temp: 97.8 F (36.6 C)  97.8 F (36.6 C) (!) 97.5 F (36.4 C)  TempSrc: Oral  Oral Oral  SpO2: 100% (!) 88% 100% 99%  Weight:        Intake/Output Summary (Last 24 hours) at 07/30/2019 0844 Last data filed at 07/30/2019 0400 Gross per 24 hour  Intake 793.82 ml  Output --  Net 793.82 ml   Filed Weights   07/29/19 0500  Weight: 98.4 kg    Examination:  General exam: Sleepy, NAD, not very interactive with physical exam Respiratory system: CTA, no rales wheeze rhonchi's  Cardiovascular system: RRR, S1-S2 no murmurs rubs gallops  Gastrointestinal system: Soft, nontender nondistended positive bowel sounds  Central nervous system: Sleepy, unable to assess Extremities: No edema, no cyanosis Skin: Warm dry Psychiatry: Unable to  assess as patient is sleepy    Data Reviewed: I have personally reviewed following labs and imaging studies  CBC: Recent Labs  Lab 07/23/19 1022 07/28/19 1152 07/29/19 0455  WBC 8.4 10.9* 8.9  HGB 12.7 14.9 13.4  HCT 38.3 43.5 38.9  MCV 82.0 80.4 81.2  PLT 394 457* 370   Basic Metabolic Panel: Recent Labs  Lab 07/23/19 1022 07/28/19 1152 07/29/19 0455  NA 132* 127* 132*  K 4.4 5.0 3.8  CL 95* 96* 104  CO2 27 12* 18*  GLUCOSE 132* 111* 108*  BUN 25* 16 14  CREATININE 1.08* 1.12* 0.93  CALCIUM 9.2 9.5 8.9   GFR: Estimated Creatinine Clearance: 59.3 mL/min (by C-G formula based on SCr of 0.93 mg/dL). Liver Function Tests: Recent Labs  Lab 07/28/19 1152  AST 36  ALT 38  ALKPHOS 94  BILITOT 1.7*  PROT 7.9  ALBUMIN 4.7   No results for input(s): LIPASE, AMYLASE in the last 168 hours. Recent Labs  Lab 07/28/19 1152  AMMONIA 15   Coagulation Profile: No results for input(s): INR, PROTIME in the last 168 hours. Cardiac Enzymes: No results for input(s): CKTOTAL, CKMB, CKMBINDEX, TROPONINI in the last 168 hours. BNP (last 3 results) No results for input(s): PROBNP in the last 8760 hours. HbA1C: No results for input(s): HGBA1C in the last 72 hours. CBG: Recent Labs  Lab 07/29/19 1745 07/29/19 1947 07/29/19 2341 07/30/19  0407 07/30/19 0809  GLUCAP 86 99 78 93 85   Lipid Profile: No results for input(s): CHOL, HDL, LDLCALC, TRIG, CHOLHDL, LDLDIRECT in the last 72 hours. Thyroid Function Tests: Recent Labs    07/28/19 1154  TSH 2.667   Anemia Panel: No results for input(s): VITAMINB12, FOLATE, FERRITIN, TIBC, IRON, RETICCTPCT in the last 72 hours. Sepsis Labs: No results for input(s): PROCALCITON, LATICACIDVEN in the last 168 hours.  Recent Results (from the past 240 hour(s))  SARS Coronavirus 2 by RT PCR (hospital order, performed in [facility] hospital lab) Nasopharyngeal Nasopharyngeal Swab     Status: None   Collection Time: 07/28/19  1:42 PM    Specimen: Nasopharyngeal Swab  Result Value Ref Range Status   SARS Coronavirus 2 NEGATIVE NEGATIVE Final    Comment: (NOTE) SARS-CoV-2 target nucleic acids are NOT DETECTED.  The SARS-CoV-2 RNA is generally detectable in upper and lower respiratory specimens during the acute phase of infection. The lowest concentration of SARS-CoV-2 viral copies this assay can detect is 250 copies / mL. A negative result does not preclude SARS-CoV-2 infection and should not be used as the sole basis for treatment or other patient management decisions.  A negative result may occur with improper specimen collection / handling, submission of specimen other than nasopharyngeal swab, presence of viral mutation(s) within the areas targeted by this assay, and inadequate number of viral copies (<250 copies / mL). A negative result must be combined with clinical observations, patient history, and epidemiological information.  Fact Sheet for Patients:   BoilerBrush.com.cy  Fact Sheet for Healthcare Providers: https://pope.com/  This test is not yet approved or  cleared by the [place] FDA and has been authorized for detection and/or diagnosis of SARS-CoV-2 by FDA under an Emergency Use Authorization (EUA).  This EUA will remain in effect (meaning this test can be used) for the duration of the COVID-19 declaration under Section 564(b)(1) of the Act, 21 U.S.C. section 360bbb-3(b)(1), unless the authorization is terminated or revoked sooner.  Performed at [facility], [address], [place], [place] [address]          Radiology Studies: CT Head Wo Contrast  Result Date: 07/28/2019 CLINICAL DATA:  Altered mental status (AMS), unclear cause. EXAM: CT HEAD WITHOUT CONTRAST TECHNIQUE: Contiguous axial images were obtained from the base of the skull through the vertex without intravenous contrast. COMPARISON:  Prior head CT examinations  07/18/2019 and earlier FINDINGS: Brain: Stable, mild generalized parenchymal atrophy. Mild patchy and ill-defined hypoattenuation within the cerebral white matter is nonspecific, but consistent with chronic small vessel ischemic disease. There is no acute intracranial hemorrhage. No demarcated cortical infarct is identified. No extra-axial fluid collection. No evidence of intracranial mass. No midline shift. Vascular: No hyperdense vessel. Skull: Normal. Negative for fracture or focal lesion. Sinuses/Orbits: Visualized orbits show no acute finding. No significant paranasal sinus disease or mastoid effusion at the imaged levels. IMPRESSION: No CT evidence of acute intracranial abnormality. Stable, mild generalized parenchymal atrophy and chronic small vessel ischemic disease. Electronically Signed   By: Jackey Loge DO   On: 07/28/2019 12:30   DG Chest Portable 1 View  Result Date: 07/28/2019 CLINICAL DATA:  Altered mental status. EXAM: PORTABLE CHEST 1 VIEW COMPARISON:  Portable chest 06/19/2018. FINDINGS: Mediastinum hilar structures normal. Heart size normal. No focal infiltrate. No pleural effusion or pneumothorax. Breast implants noted. Right shoulder replacement. IMPRESSION: No acute cardiopulmonary disease. Electronically Signed   By: Maisie Fus  Register   On: 07/28/2019  12:51        Scheduled Meds: . amantadine  100 mg Oral BID  . ascorbic acid  250 mg Oral Daily  . atorvastatin  10 mg Oral QPM  . cholecalciferol  1,000 Units Oral Daily  . clonazePAM  1.5 mg Oral BID  . cloNIDine  0.3 mg Transdermal Q Fri  . docusate sodium  100 mg Oral BID  . doxepin  300 mg Oral QHS  . fluticasone furoate-vilanterol  1 puff Inhalation Daily  . gabapentin  300 mg Oral TID  . glycopyrrolate  2 mg Oral TID  . insulin aspart  0-9 Units Subcutaneous Q4H  . losartan  100 mg Oral Daily  . metFORMIN  1,000 mg Oral BID WC  . pantoprazole  40 mg Oral Daily  . propranolol  80 mg Oral BID  . rivaroxaban  20 mg  Oral Q supper  . tiotropium  18 mcg Inhalation Daily  . vitamin B-12  1,000 mcg Oral Daily   Continuous Infusions: . sodium chloride 75 mL/hr at 07/30/19 0511    Assessment & Plan: Patient is a 69 year old female anxiety, depression, GERD, fibromyalgia, diabetes, hypertension was recently hospitalized from 07/18/2019-07/20/2019 due to frequent falls is being admitted for failure to thrive, hyponatremia, severe anxiety spells and family unable to manage at home  Anxiety/depression -Psychiatrist input was appreciated.  Her medications were simplified . SSRIs and SNRIs were discontinued by psych due to possible cause of hyponatremia continue home medications for now Dr. Smith Rober[facility]o [facility] and [facility] - discussed with case mx today , they will check into this. Problems psych [facility] -they do not think patient should go to nursing home directly after this but needs NH after [facility]     Hyponatremia Her baseline sodium is around 130, On admission was 127.  Likely 2/2 decrease po itnake Now improved to 132 with ivf Will continue ivf Monitor labs     Acute kidney injury Likely prerenal from poor p.o. intake and dehydration Resolved with hydration with ivf Creatinine 0.93 today Avoid nephrotoxic medications     Failure to thrive, generalized weakness Multifactorial the biggest issue is her mental health.  [facility] by Dr. Luanna SalkMonro every 3 weeks. Per her son patient always had this issue all her life and has required extensive care PT OT Needs [facility] evaluation prior to nursing home   DM type 2- on metformin 1000 mg twice daily at home Blood glucose levels controlled but more on the lower side while inpatient. May still have issues with p.o. intake, thus will decrease Metformin to 500 mg twice daily R ISS, check fingersticks   DVT prophylaxis: Xarelto Code  Status:full Family Communication: None at bedside Disposition Plan: [facility] psych and then nursing home Status is: Inpatient  Remains inpatient appropriate because:Unsafe d/c plan   Dispo: The patient is from: Home              [facility]              Anticipated d/c date is: 2 days              Patient currently is not medically stable to d/c.unsafe d/c planning            LOS: 2 days   Time spent: 35 minutes with more than 50% on COC    Lynn ItoSahar Leidy Massar, MD  [facility] Pager [phone]  If 7PM-7AM, please contact night-coverage [email or web address] Password [facility] 07/30/2019, 8:44 AM

## 2019-07-31 LAB — GLUCOSE, CAPILLARY
Glucose-Capillary: 100 mg/dL — ABNORMAL HIGH (ref 70–99)
Glucose-Capillary: 101 mg/dL — ABNORMAL HIGH (ref 70–99)
Glucose-Capillary: 82 mg/dL (ref 70–99)
Glucose-Capillary: 84 mg/dL (ref 70–99)
Glucose-Capillary: 90 mg/dL (ref 70–99)

## 2019-07-31 LAB — SODIUM: Sodium: 134 mmol/L — ABNORMAL LOW (ref 135–145)

## 2019-07-31 MED ORDER — METFORMIN HCL 500 MG PO TABS: 500.0000 mg | ORAL_TABLET | Freq: Two times a day (BID) | ORAL | Status: DC

## 2019-07-31 MED ORDER — LOSARTAN POTASSIUM 50 MG PO TABS: 50.0000 mg | ORAL_TABLET | Freq: Every day | ORAL | Status: AC

## 2019-07-31 MED ORDER — CLONIDINE 0.3 MG/24HR TD PTWK: 0.3000 mg | MEDICATED_PATCH | TRANSDERMAL | 12 refills | Status: AC

## 2019-07-31 MED ORDER — DOCUSATE SODIUM 100 MG PO CAPS: 100.0000 mg | ORAL_CAPSULE | Freq: Two times a day (BID) | ORAL | 0 refills | Status: AC

## 2019-07-31 MED ORDER — LOSARTAN POTASSIUM 50 MG PO TABS: 50.0000 mg | ORAL_TABLET | Freq: Every day | ORAL | Status: DC

## 2019-07-31 MED ORDER — HYDROXYZINE HCL 50 MG PO TABS: 50.0000 mg | ORAL_TABLET | Freq: Every evening | ORAL | 0 refills | Status: AC | PRN

## 2019-07-31 MED ORDER — CLONAZEPAM 0.5 MG PO TABS: 1.5000 mg | ORAL_TABLET | Freq: Two times a day (BID) | ORAL | 0 refills | Status: AC

## 2019-07-31 MED ORDER — GABAPENTIN 300 MG PO CAPS: 300.0000 mg | ORAL_CAPSULE | Freq: Three times a day (TID) | ORAL | Status: AC

## 2019-07-31 NOTE — [place] FL2 (Signed)
[place] MEDICAID FL2 LEVEL OF CARE SCREENING TOOL     IDENTIFICATION  Patient Name: Leslie Gould Birthdate: 09-08-1950 Sex: female Admission Date (Current Location): 07/28/2019  [place] and [place] Number:  Chiropodist and Address:  [facility], [address] [place], [place] [address]      Provider Number: [id number]  Attending Physician Name and Address:  Lynn Ito, MD  Relative Name and Phone Number:  Dara Hoyer, [phone]    Current Level of Care: Hospital Recommended Level of Care: Skilled Nursing Facility Prior Approval Number:    Date Approved/Denied:   PASRR Number: [id number]  Discharge Plan: SNF    Current Diagnoses: Patient Active Problem List   Diagnosis Date Noted  . Hyponatremia 07/28/2019  . AKI (acute kidney injury) (HCC) 07/18/2019  . Acute lower UTI 07/18/2019  . Frequent falls 07/18/2019  . Obesity, Class III, BMI 40-49.9 (morbid obesity) (HCC) 07/18/2019  . History of recurrent deep vein thrombosis (DVT) 07/08/2017  . Allergic rhinitis 07/08/2017  . Hypertension   . GERD (gastroesophageal reflux disease)   . Fibromyalgia   . Diabetes mellitus without complication (HCC)   . Depression   . Chronic fatigue   . Asthma   . Anxiety     Orientation RESPIRATION BLADDER Height & Weight     Self, Place  Normal Continent Weight: 93.9 kg Height:     BEHAVIORAL SYMPTOMS/MOOD NEUROLOGICAL BOWEL NUTRITION STATUS      Continent Diet (Regular)  AMBULATORY STATUS COMMUNICATION OF NEEDS Skin   Limited Assist Verbally Normal                       Personal Care Assistance Level of Assistance  Bathing, Feeding, Dressing Bathing Assistance: Limited assistance Feeding assistance: Limited assistance Dressing Assistance: Limited assistance     Functional Limitations Info             SPECIAL CARE FACTORS FREQUENCY  PT (By licensed PT), OT (By licensed OT)     PT Frequency: 5 times per week OT  Frequency: 3 times per week            Contractures Contractures Info: Not present    Additional Factors Info  Allergies   Allergies Info: Lithium           Current Medications (07/31/2019):  This is the current hospital active medication list Current Facility-Administered Medications  Medication Dose Route Frequency Provider Last Rate Last Admin  . 0.9 %  sodium chloride infusion   Intravenous Continuous Delfino Lovett, MD 75 mL/hr at 07/31/19 0419 New Bag at 07/31/19 0419  . amantadine (SYMMETREL) capsule 100 mg  100 mg Oral BID Roselind Messier, MD   100 mg at 07/31/19 0844  . ascorbic acid (VITAMIN C) tablet 250 mg  250 mg Oral Daily Roselind Messier, MD   250 mg at 07/31/19 0842  . atorvastatin (LIPITOR) tablet 10 mg  10 mg Oral QPM Sherryll Burger, Vipul, MD   10 mg at 07/30/19 1700  . azelastine (ASTELIN) 0.1 % nasal spray 2 spray  2 spray Each Nare Daily PRN Delfino Lovett, MD      . bisacodyl (DULCOLAX) EC tablet 5 mg  5 mg Oral Daily PRN Delfino Lovett, MD      . cholecalciferol (VITAMIN D3) tablet 1,000 Units  1,000 Units Oral Daily Delfino Lovett, MD   1,000 Units at 07/31/19 0844  . clonazePAM (KLONOPIN) tablet 1.5 mg  1.5 mg Oral BID Roselind Messier,  MD   1.5 mg at 07/31/19 0843  . cloNIDine (CATAPRES - Dosed in mg/24 hr) patch 0.3 mg  0.3 mg Transdermal Q Rhea Bleacher, Vipul, MD   0.3 mg at 07/28/19 1816  . docusate sodium (COLACE) capsule 100 mg  100 mg Oral BID Delfino Lovett, MD   100 mg at 07/31/19 0844  . doxepin (SINEQUAN) capsule 300 mg  300 mg Oral QHS Delfino Lovett, MD   300 mg at 07/30/19 2145  . fluticasone (FLONASE) 50 MCG/ACT nasal spray 1 spray  1 spray Each Nare Daily PRN Sherryll Burger, Vipul, MD      . fluticasone furoate-vilanterol (BREO ELLIPTA) 200-25 MCG/INH 1 puff  1 puff Inhalation Daily Delfino Lovett, MD   1 puff at 07/31/19 0845  . gabapentin (NEURONTIN) capsule 300 mg  300 mg Oral TID Roselind Messier, MD   300 mg at 07/31/19 0844  . glycopyrrolate (ROBINUL) tablet 2 mg  2 mg Oral TID  Delfino Lovett, MD   2 mg at 07/31/19 0844  . hydrOXYzine (ATARAX/VISTARIL) tablet 50-100 mg  50-100 mg Oral QHS PRN Delfino Lovett, MD      . insulin aspart (novoLOG) injection 0-9 Units  0-9 Units Subcutaneous Q4H Lynn Ito, MD      . LORazepam (ATIVAN) tablet 1 mg  1 mg Oral Q6H PRN Roselind Messier, MD   1 mg at 07/29/19 2111  . losartan (COZAAR) tablet 100 mg  100 mg Oral Daily Delfino Lovett, MD   100 mg at 07/31/19 0843  . metFORMIN (GLUCOPHAGE) tablet 500 mg  500 mg Oral BID WC Lynn Ito, MD   500 mg at 07/31/19 0844  . ondansetron (ZOFRAN) tablet 4 mg  4 mg Oral Q6H PRN Delfino Lovett, MD       Or  . ondansetron (ZOFRAN) injection 4 mg  4 mg Intravenous Q6H PRN Delfino Lovett, MD      . pantoprazole (PROTONIX) EC tablet 40 mg  40 mg Oral Daily Delfino Lovett, MD   40 mg at 07/31/19 0843  . propranolol (INDERAL) tablet 80 mg  80 mg Oral BID Delfino Lovett, MD   80 mg at 07/30/19 2144  . rivaroxaban (XARELTO) tablet 20 mg  20 mg Oral Q supper Delfino Lovett, MD   20 mg at 07/30/19 1655  . tiotropium (SPIRIVA) inhalation capsule ([facility] use ONLY) 18 mcg  18 mcg Inhalation Daily Delfino Lovett, MD   18 mcg at 07/31/19 0845  . vitamin B-12 (CYANOCOBALAMIN) tablet 1,000 mcg  1,000 mcg Oral Daily Delfino Lovett, MD   1,000 mcg at 07/31/19 3546     Discharge Medications: Please see discharge summary for a list of discharge medications.  Relevant Imaging Results:  Relevant Lab Results:   Additional Information SS# [id number]  Barrie Dunker, RN

## 2019-07-31 NOTE — TOC Progression Note (Signed)
Transition of Care [facility]) - Progression Note    Patient Details  Name: Leslie Gould MRN: [id number] Date of Birth: November 22, 1950  Transition of [facility]) CM/SW Contact  Barrie Dunker, RN Phone Number: 07/31/2019, 9:26 AM  Clinical Narrative:    Spoke with the patient's son Leslie Gould at [phone] He would like his mom to be placed in a short term rehab with the goal to be able to return home where she lives alone,  He would like the bed search to be in the [place] area, will send out the bed search and review the options once obtained        Expected Discharge Plan and Services                                                 Social Determinants of Health (SDOH) Interventions    Readmission Risk Interventions No flowsheet data found.

## 2019-07-31 NOTE — Discharge Summary (Signed)
Leslie Gould ZOX:[id number] DOB: [birth year] DOA: 07/28/2019  PCP: Shane Crutch, PA  Admit date: 07/28/2019 Discharge date: 07/31/2019  Admitted From: Home Disposition: SNF  Recommendations for Outpatient Follow-up:  1. Follow up with PCP in 1 week 2. Please obtain BMP/CBC in one week 3. Post SNF she does need to go straight to [facility] . She is unable to go before snf as she is not independent per case management.     Discharge Condition:Stable CODE STATUS: Full Diet recommendation: Regular diet  Brief/Interim Summary: Leslie Gould  is a 69 y.o. female with a known history of anxiety, depression, GERD, fibromyalgia, diabetes, hypertension was recently hospitalized from 07/18/2019-07/20/2019 due to frequent falls in the setting of tremor.  She is being readmitted for hyponatremia, poor p.o. intake, generalized anxiety disorder and unable to be managed at home per son.  Per son, gotten more weaker since last D/C, lost the appetite. Remains confined to one room. Can't stand even with walker. She knows what to say but can't express. C/o anxiety all week but unable to manage her home.  She is always required extensive medical care all her life per her son. Son would like her to be placed as she is not able to stay independent.   She was admitted to hospitalist service.  Psychiatry was consulted.  Anxiety/depression -Psychiatry was consulted, psych discontinued patient's SSRIs and SNRIs  due to possible cause of hyponatremia continue home medications for now Dr. Smith Robert recommended [facility] transfer prior to NH and assisted living -unfortunately she is unable to go because she is not independent.  She does need to go to [facility] after she completes rehab       Hyponatremia Her baseline sodium is around 130, On admission was 127.  Likely 2/2 decrease po itnake Improved to 134 with ivf.  Continue to encourage po intake and hydration     Acute kidney injury Likely  prerenal from poor p.o. intake and dehydration Resolved with hydration with ivf Avoid nephrotoxic medications     Failure to thrive, generalized weakness Multifactorial the biggest issue is her mental health. She is followed by [place] [facility] by Dr. Luanna Salk every 3 weeks. Per her son patient always had this issue all her life and has required extensive care PT OT- Needs Geri psych after snf   DM type 2- on metformin 1000 mg twice daily at home Blood glucose levels controlled but more on the lower side while inpatient. Decreased metformin to  bid.  Check fs       Discharge Instructions  Discharge Instructions    Call MD for:  persistant dizziness or light-headedness   Complete by: As directed    Call MD for:  temperature >100.4   Complete by: As directed    Diet - low sodium heart healthy   Complete by: As directed    Increase activity slowly   Complete by: As directed      Allergies as of 07/31/2019      Reactions   Lithium Other (See Comments)   Tremors, nausea Tremors, nausea      Medication List    STOP taking these medications   benztropine 1 MG tablet Commonly known as: COGENTIN   cefdinir 300 MG capsule Commonly known as: OMNICEF   citalopram 20 MG tablet Commonly known as: CELEXA   doxepin 100 MG capsule Commonly known as: SINEQUAN   gabapentin 600 MG tablet Commonly known as: NEURONTIN Replaced by: gabapentin 300 MG capsule   hydrOXYzine  50 MG capsule Commonly known as: VISTARIL Replaced by: hydrOXYzine 50 MG tablet   sulfamethoxazole-trimethoprim 800-160 MG tablet Commonly known as: BACTRIM DS   Victoza 18 MG/3ML Sopn Generic drug: liraglutide     TAKE these medications   Amantadine HCl 100 MG tablet Take 150 mg by mouth 2 (two) times daily.   ascorbic acid 500 MG tablet Commonly known as: VITAMIN C Take 500 mg by mouth daily.   atorvastatin 10 MG tablet Commonly known as: LIPITOR Take 10 mg by mouth  every evening.   azelastine 0.1 % nasal spray Commonly known as: ASTELIN Place 2 sprays into both nostrils daily as needed for rhinitis or allergies.   Breo Ellipta 200-25 MCG/INH Aepb Generic drug: fluticasone furoate-vilanterol Inhale 1 puff into the lungs daily.   cholecalciferol 25 MCG (1000 UNIT) tablet Commonly known as: VITAMIN D3 Take 1,000 Units by mouth daily.   clonazePAM 0.5 MG tablet Commonly known as: KLONOPIN Take 3 tablets (1.5 mg total) by mouth 2 (two) times daily. What changed:   medication strength  how much to take  when to take this  reasons to take this   cloNIDine 0.3 mg/24hr patch Commonly known as: CATAPRES - Dosed in mg/24 hr Place 0.3 mg onto the skin once a week. What changed: Another medication with the same name was added. Make sure you understand how and when to take each.   cloNIDine 0.3 mg/24hr patch Commonly known as: CATAPRES - Dosed in mg/24 hr Place 1 patch (0.3 mg total) onto the skin every Friday. Start taking on: August 04, 2019 What changed: You were already taking a medication with the same name, and this prescription was added. Make sure you understand how and when to take each.   docusate sodium 100 MG capsule Commonly known as: COLACE Take 1 capsule (100 mg total) by mouth 2 (two) times daily.   fluticasone 50 MCG/ACT nasal spray Commonly known as: FLONASE Place 1 spray into both nostrils daily as needed for allergies or rhinitis.   gabapentin 300 MG capsule Commonly known as: NEURONTIN Take 1 capsule (300 mg total) by mouth 3 (three) times daily. Replaces: gabapentin 600 MG tablet   glycopyrrolate 2 MG tablet Commonly known as: ROBINUL Take 2 mg by mouth 3 (three) times daily.   hydrOXYzine 50 MG tablet Commonly known as: ATARAX/VISTARIL Take 1-2 tablets (50-100 mg total) by mouth at bedtime as needed for anxiety (sleep). Replaces: hydrOXYzine 50 MG capsule   losartan 50 MG tablet Commonly known as: COZAAR Take  1 tablet (50 mg total) by mouth daily. Start taking on: August 01, 2019 What changed:   medication strength  how much to take   metFORMIN 500 MG tablet Commonly known as: GLUCOPHAGE Take 1 tablet (500 mg total) by mouth 2 (two) times daily with a meal. What changed:   medication strength  how much to take   omeprazole 40 MG capsule Commonly known as: PRILOSEC Take 40 mg by mouth daily.   propranolol 80 MG tablet Commonly known as: INDERAL Take 80 mg by mouth 2 (two) times daily.   rivaroxaban 20 MG Tabs tablet Commonly known as: XARELTO Take 20 mg by mouth daily with supper.   tiotropium 18 MCG inhalation capsule Commonly known as: SPIRIVA Place 1 capsule (18 mcg total) into inhaler and inhale daily.   vitamin B-12 1000 MCG tablet Commonly known as: CYANOCOBALAMIN Take 1,000 mcg by mouth daily.       Contact information for follow-up providers  Shane Crutch, PA Follow up in 1 week(s).   Specialty: Family Medicine Contact information: [address] [place] [address] [phone]            Contact information for after-discharge care    Destination    [facility] Preferred SNF .   Service: Skilled Nursing Contact information: [address] [place] [address] [phone]                 Allergies  Allergen Reactions  . Lithium Other (See Comments)    Tremors, nausea Tremors, nausea    Consultations:  Psychiatry   Procedures/Studies: CT Head Wo Contrast  Result Date: 07/28/2019 CLINICAL DATA:  Altered mental status (AMS), unclear cause. EXAM: CT HEAD WITHOUT CONTRAST TECHNIQUE: Contiguous axial images were obtained from the base of the skull through the vertex without intravenous contrast. COMPARISON:  Prior head CT examinations 07/18/2019 and earlier FINDINGS: Brain: Stable, mild generalized parenchymal atrophy. Mild patchy and ill-defined hypoattenuation within the cerebral white matter is  nonspecific, but consistent with chronic small vessel ischemic disease. There is no acute intracranial hemorrhage. No demarcated cortical infarct is identified. No extra-axial fluid collection. No evidence of intracranial mass. No midline shift. Vascular: No hyperdense vessel. Skull: Normal. Negative for fracture or focal lesion. Sinuses/Orbits: Visualized orbits show no acute finding. No significant paranasal sinus disease or mastoid effusion at the imaged levels. IMPRESSION: No CT evidence of acute intracranial abnormality. Stable, mild generalized parenchymal atrophy and chronic small vessel ischemic disease. Electronically Signed   By: Jackey Loge DO   On: 07/28/2019 12:30   CT Head Wo Contrast  Result Date: 07/18/2019 CLINICAL DATA:  Ataxia. EXAM: CT HEAD WITHOUT CONTRAST TECHNIQUE: Contiguous axial images were obtained from the base of the skull through the vertex without intravenous contrast. COMPARISON:  October 27, 2018. FINDINGS: Brain: Mild diffuse cortical atrophy is noted. Mild chronic ischemic white matter disease is noted. No mass effect or midline shift is noted. Ventricular size is within normal limits. There is no evidence of mass lesion, hemorrhage or acute infarction. Vascular: No hyperdense vessel or unexpected calcification. Skull: Normal. Negative for fracture or focal lesion. Sinuses/Orbits: No acute finding. Other: None. IMPRESSION: Mild diffuse cortical atrophy. Mild chronic ischemic white matter disease. No acute intracranial abnormality seen. Electronically Signed   By: Lupita Raider M.D.   On: 07/18/2019 09:57   DG Chest Portable 1 View  Result Date: 07/28/2019 CLINICAL DATA:  Altered mental status. EXAM: PORTABLE CHEST 1 VIEW COMPARISON:  Portable chest 06/19/2018. FINDINGS: Mediastinum hilar structures normal. Heart size normal. No focal infiltrate. No pleural effusion or pneumothorax. Breast implants noted. Right shoulder replacement. IMPRESSION: No acute cardiopulmonary  disease. Electronically Signed   By: Maisie Fus  Register   On: 07/28/2019 12:51   DG Foot Complete Left  Result Date: 07/23/2019 CLINICAL DATA:  Left foot injury five days ago due to fall. EXAM: LEFT FOOT - COMPLETE 3+ VIEW COMPARISON:  None. FINDINGS: Examination demonstrates a moderately displaced oblique fracture through the base of the fourth proximal phalanx as well as fifth proximal phalanx both involving the articular surface. Fusion of the second and third middle and proximal phalanges. Single orthopedic screw over the head of the second metatarsal. Mild degenerative changes over the midfoot. Small inferior calcaneal spur. IMPRESSION: Displaced oblique fractures involving the fourth and fifth proximal phalanges. Electronically Signed   By: Elberta Fortis M.D.   On: 07/23/2019 10:24       Subjective: Has  no complaints this AM.  Laying in bed.  Denies shortness of breath, chest pain, dizziness  Discharge Exam: Vitals:   07/31/19 0112 07/31/19 0733  BP:  123/73  Pulse: 67 62  Resp:  18  Temp:  (!) 97.5 F (36.4 C)  SpO2: 97%    Vitals:   07/31/19 0046 07/31/19 0112 07/31/19 0252 07/31/19 0733  BP: 128/66   123/73  Pulse: 69 67  62  Resp: 16   18  Temp: 97.9 F (36.6 C)   (!) 97.5 F (36.4 C)  TempSrc: Oral     SpO2: (!) 73% 97%    Weight:   93.9 kg     General: Pt is alert, awake, not in acute distress Cardiovascular: RRR, S1/S2 +, no rubs, no gallops Respiratory: CTA bilaterally, no wheezing, no rhonchi Abdominal: Soft, NT, ND, bowel sounds + Extremities: no edema, no cyanosis    The results of significant diagnostics from this hospitalization (including imaging, microbiology, ancillary and laboratory) are listed below for reference.     Microbiology: Recent Results (from the past 240 hour(s))  SARS Coronavirus 2 by RT PCR (hospital order, performed in [facility] hospital lab) Nasopharyngeal Nasopharyngeal Swab     Status: None   Collection Time: 07/28/19  1:42 PM    Specimen: Nasopharyngeal Swab  Result Value Ref Range Status   SARS Coronavirus 2 NEGATIVE NEGATIVE Final    Comment: (NOTE) SARS-CoV-2 target nucleic acids are NOT DETECTED.  The SARS-CoV-2 RNA is generally detectable in upper and lower respiratory specimens during the acute phase of infection. The lowest concentration of SARS-CoV-2 viral copies this assay can detect is 250 copies / mL. A negative result does not preclude SARS-CoV-2 infection and should not be used as the sole basis for treatment or other patient management decisions.  A negative result may occur with improper specimen collection / handling, submission of specimen other than nasopharyngeal swab, presence of viral mutation(s) within the areas targeted by this assay, and inadequate number of viral copies (<250 copies / mL). A negative result must be combined with clinical observations, patient history, and epidemiological information.  Fact Sheet for Patients:   BoilerBrush.com.cy  Fact Sheet for Healthcare Providers: https://pope.com/  This test is not yet approved or  cleared by the [place] FDA and has been authorized for detection and/or diagnosis of SARS-CoV-2 by FDA under an Emergency Use Authorization (EUA).  This EUA will remain in effect (meaning this test can be used) for the duration of the COVID-19 declaration under Section 564(b)(1) of the Act, 21 U.S.C. section 360bbb-3(b)(1), unless the authorization is terminated or revoked sooner.  Performed at [facility], [address] [place], [place] [address]      Labs: BNP (last 3 results) No results for input(s): BNP in the last 8760 hours. Basic Metabolic Panel: Recent Labs  Lab 07/28/19 1152 07/29/19 0455 07/31/19 0711  NA 127* 132* 134*  K 5.0 3.8  --   CL 96* 104  --   CO2 12* 18*  --   GLUCOSE 111* 108*  --   BUN 16 14  --   CREATININE 1.12* 0.93  --   CALCIUM 9.5 8.9   --    Liver Function Tests: Recent Labs  Lab 07/28/19 1152  AST 36  ALT 38  ALKPHOS 94  BILITOT 1.7*  PROT 7.9  ALBUMIN 4.7   No results for input(s): LIPASE, AMYLASE in the last 168 hours. Recent Labs  Lab 07/28/19 1152  AMMONIA 15   CBC: Recent Labs  Lab 07/28/19 1152 07/29/19 0455  WBC 10.9* 8.9  HGB 14.9 13.4  HCT 43.5 38.9  MCV 80.4 81.2  PLT 457* 370   Cardiac Enzymes: No results for input(s): CKTOTAL, CKMB, CKMBINDEX, TROPONINI in the last 168 hours. BNP: Invalid input(s): POCBNP CBG: Recent Labs  Lab 07/30/19 2012 07/31/19 0046 07/31/19 0405 07/31/19 0813 07/31/19 1203  GLUCAP 81 90 100* 84 101*   D-Dimer No results for input(s): DDIMER in the last 72 hours. Hgb A1c No results for input(s): HGBA1C in the last 72 hours. Lipid Profile No results for input(s): CHOL, HDL, LDLCALC, TRIG, CHOLHDL, LDLDIRECT in the last 72 hours. Thyroid function studies No results for input(s): TSH, T4TOTAL, T3FREE, THYROIDAB in the last 72 hours.  Invalid input(s): FREET3 Anemia work up No results for input(s): VITAMINB12, FOLATE, FERRITIN, TIBC, IRON, RETICCTPCT in the last 72 hours. Urinalysis    Component Value Date/Time   COLORURINE YELLOW (A) 07/18/2019 1000   APPEARANCEUR CLOUDY (A) 07/18/2019 1000   LABSPEC 1.011 07/18/2019 1000   PHURINE 7.0 07/18/2019 1000   GLUCOSEU NEGATIVE 07/18/2019 1000   HGBUR NEGATIVE 07/18/2019 1000   BILIRUBINUR NEGATIVE 07/18/2019 1000   KETONESUR NEGATIVE 07/18/2019 1000   PROTEINUR NEGATIVE 07/18/2019 1000   NITRITE NEGATIVE 07/18/2019 1000   LEUKOCYTESUR MODERATE (A) 07/18/2019 1000   Sepsis Labs Invalid input(s): PROCALCITONIN,  WBC,  LACTICIDVEN Microbiology Recent Results (from the past 240 hour(s))  SARS Coronavirus 2 by RT PCR (hospital order, performed in [facility] hospital lab) Nasopharyngeal Nasopharyngeal Swab     Status: None   Collection Time: 07/28/19  1:42 PM   Specimen: Nasopharyngeal Swab  Result  Value Ref Range Status   SARS Coronavirus 2 NEGATIVE NEGATIVE Final    Comment: (NOTE) SARS-CoV-2 target nucleic acids are NOT DETECTED.  The SARS-CoV-2 RNA is generally detectable in upper and lower respiratory specimens during the acute phase of infection. The lowest concentration of SARS-CoV-2 viral copies this assay can detect is 250 copies / mL. A negative result does not preclude SARS-CoV-2 infection and should not be used as the sole basis for treatment or other patient management decisions.  A negative result may occur with improper specimen collection / handling, submission of specimen other than nasopharyngeal swab, presence of viral mutation(s) within the areas targeted by this assay, and inadequate number of viral copies (<250 copies / mL). A negative result must be combined with clinical observations, patient history, and epidemiological information.  Fact Sheet for Patients:   BoilerBrush.com.cyhttps://www.fda.gov/media/136312/download  Fact Sheet for Healthcare Providers: https://pope.com/https://www.fda.gov/media/136313/download  This test is not yet approved or  cleared by the [place] FDA and has been authorized for detection and/or diagnosis of SARS-CoV-2 by FDA under an Emergency Use Authorization (EUA).  This EUA will remain in effect (meaning this test can be used) for the duration of the COVID-19 declaration under Section 564(b)(1) of the Act, 21 U.S.C. section 360bbb-3(b)(1), unless the authorization is terminated or revoked sooner.  Performed at [facility], [address] [place], [place] [id number]      Time coordinating discharge: Over 30 minutes  SIGNED:   Lynn ItoSahar Malin Cervini, MD  [facility] 07/31/2019, 3:12 PM Pager   If 7PM-7AM, please contact night-coverage [email or web address] Password [email or web address]

## 2019-07-31 NOTE — TOC Progression Note (Signed)
Transition of Care [facility]) - Progression Note    Patient Details  Name: Leslie Gould MRN: [id number] Date of Birth: 1950/04/03  Transition of Care [facility]) CM/SW Contact  Barrie Dunker, RN Phone Number: 07/31/2019, 9:58 AM  Clinical Narrative:    Sent fax to the admitting department fax number [phone] for [facility] [phone], [place] [phone] Left a VM for Hoyle Sauer requesting a call back with bed availability at [facility] [phone], Left a VM for the admissions department requesting a call abck with bed available information at the [place] [phone]       Expected Discharge Plan and Services                                                 Social Determinants of Health (SDOH) Interventions    Readmission Risk Interventions No flowsheet data found.

## 2019-07-31 NOTE — TOC Progression Note (Signed)
Transition of Care [facility]) - Progression Note    Patient Details  Name: Leslie Gould MRN: [id number] Date of Birth: 10-21-1950  Transition of Care [facility]) CM/SW Contact  Barrie Dunker, RN Phone Number: 07/31/2019, 3:32 PM  Clinical Narrative:    DC packet on the chart for the patient to DC to [facility] room 506, [facility] called by [facility] CM and they will pick up the patient at 5 PM, Verdon Cummins, the patient's son is aware, the bedside nurse to call report to [facility]        Expected Discharge Plan and Services           Expected Discharge Date: 07/31/19                                     Social Determinants of Health (SDOH) Interventions    Readmission Risk Interventions No flowsheet data found.

## 2019-07-31 NOTE — TOC Progression Note (Addendum)
Transition of Care [facility]) - Progression Note    Patient Details  Name: Leslie Gould MRN: [id number] Date of Birth: [birth year]  Transition of Care [facility]) CM/SW Contact  Su Hilt, RN Phone Number: 07/31/2019, 2:03 PM  Clinical Narrative:    Reviewed the bed offers with the patient and her son Denyse Amass, They chose the bed at [facility], I notified Chris at [facility], She has been Fully Covid vaccinated, I explained to the son, if the patient needs to go to Psych she will have to be able to be independent with Walking and ADLS, He stated that he wanted her to go to rehab to get where she can alk independently and do ADLs independently        Expected Discharge Plan and Services                                                 Social Determinants of Health (SDOH) Interventions    Readmission Risk Interventions No flowsheet data found.

## 2019-07-31 NOTE — TOC Progression Note (Signed)
Transition of Care [facility]) - Progression Note    Patient Details  Name: Leslie Gould MRN: [id number] Date of Birth: [birth year]  Transition of Care [facility]) CM/SW Contact  Barrie Dunker, RN Phone Number: 07/31/2019, 1:35 PM  Clinical Narrative:      Received a call from [facility], she provided the fax number for rehab bed referral [phone] , faxed referral, no beds available      Expected Discharge Plan and Services                                                 Social Determinants of Health (SDOH) Interventions    Readmission Risk Interventions No flowsheet data found.

## 2019-07-31 NOTE — Progress Notes (Signed)
Pt will DC to [facility]. EMS called and report was given to [place], nurse.

## 2019-08-21 IMAGING — MG DIGITAL SCREENING BILATERAL MAMMOGRAM WITH IMPLANTS, CAD AND TOM
8 of 16 series · 8 of 32 positions shown · non-contrast
Comparison: None.

CLINICAL DATA: Screening.

EXAM:
DIGITAL SCREENING BILATERAL MAMMOGRAM WITH IMPLANTS, CAD AND TOMO
The patient has bilateral retroglandular implants. Standard and
implant displaced views were performed.

[L MLO]
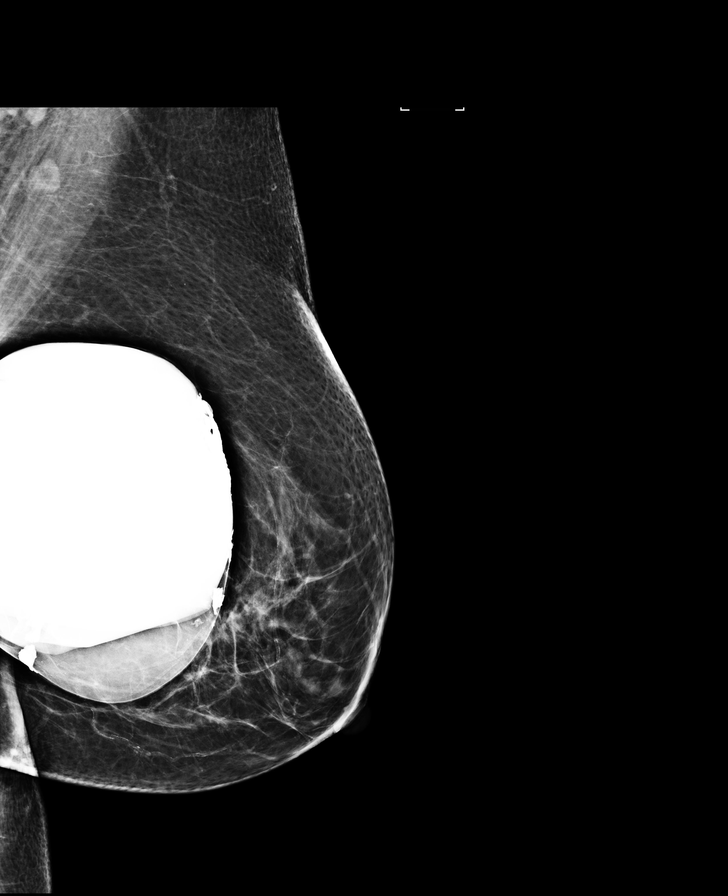

[R MLO]
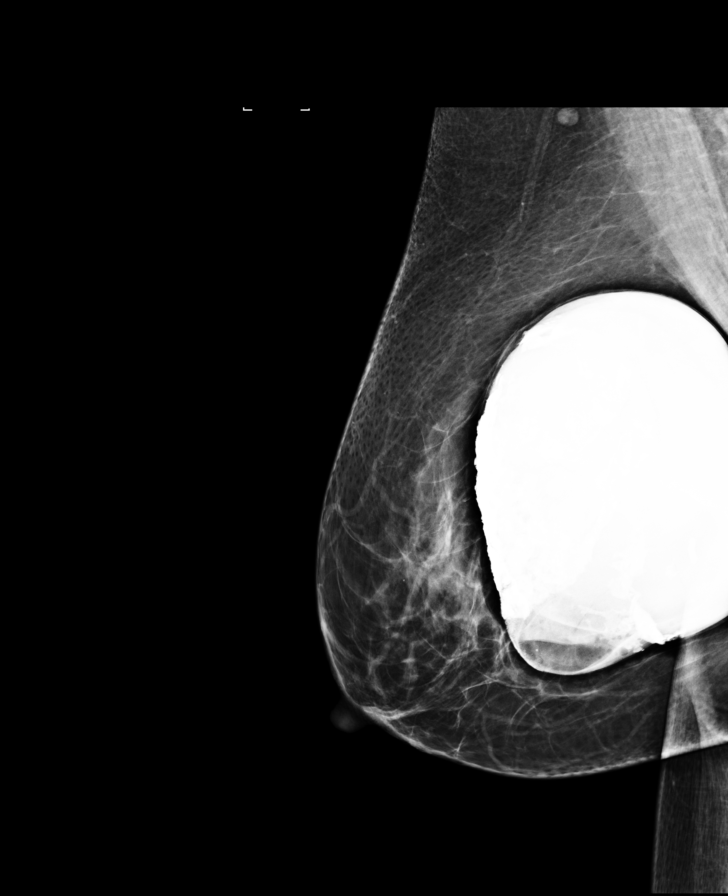

[L CC (1 of 2)]
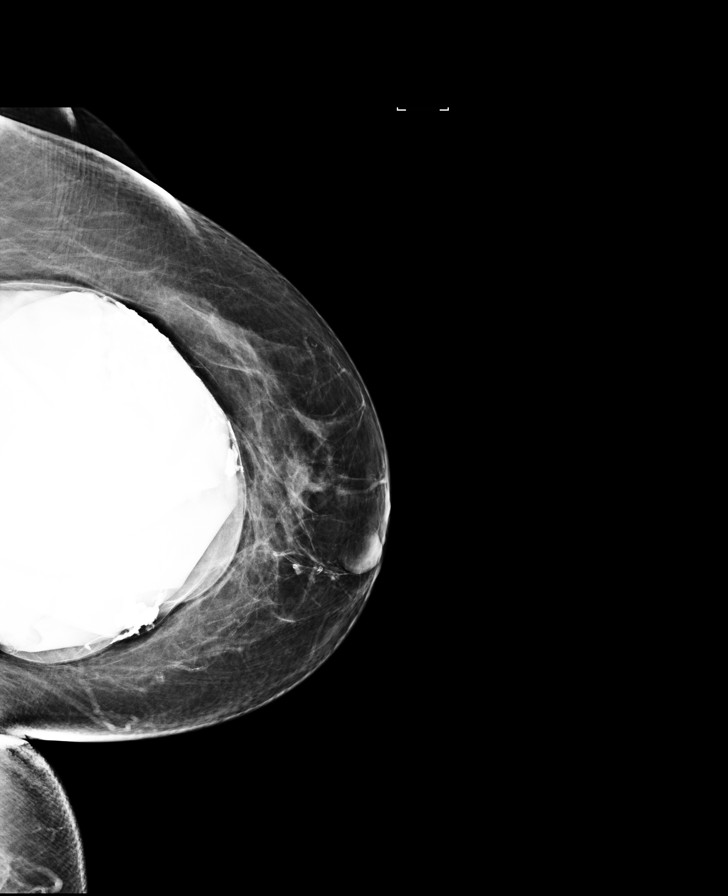

[R CC (1 of 2)]
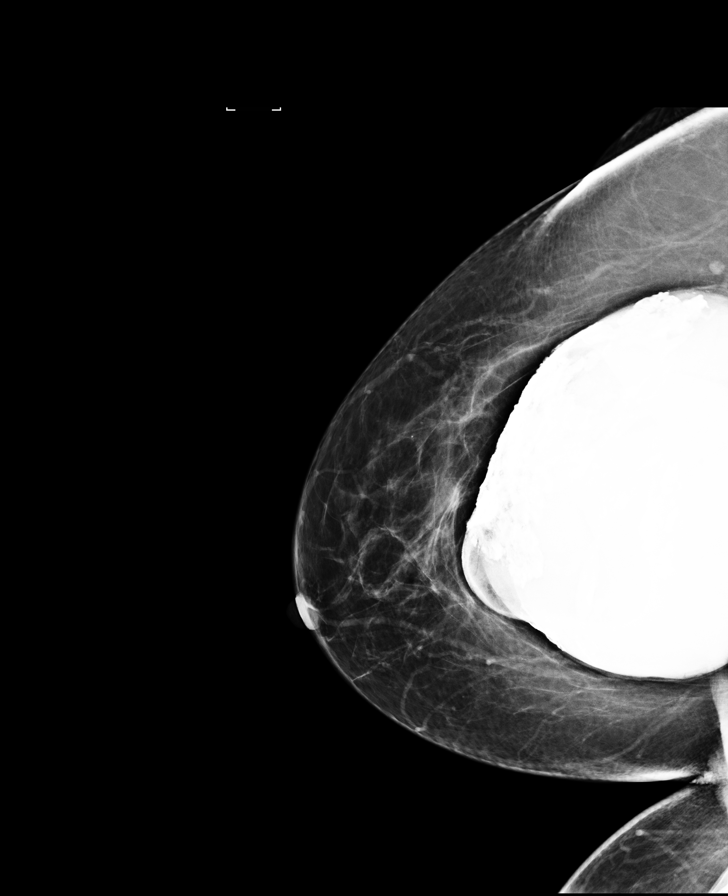

[R CC (2 of 2)]
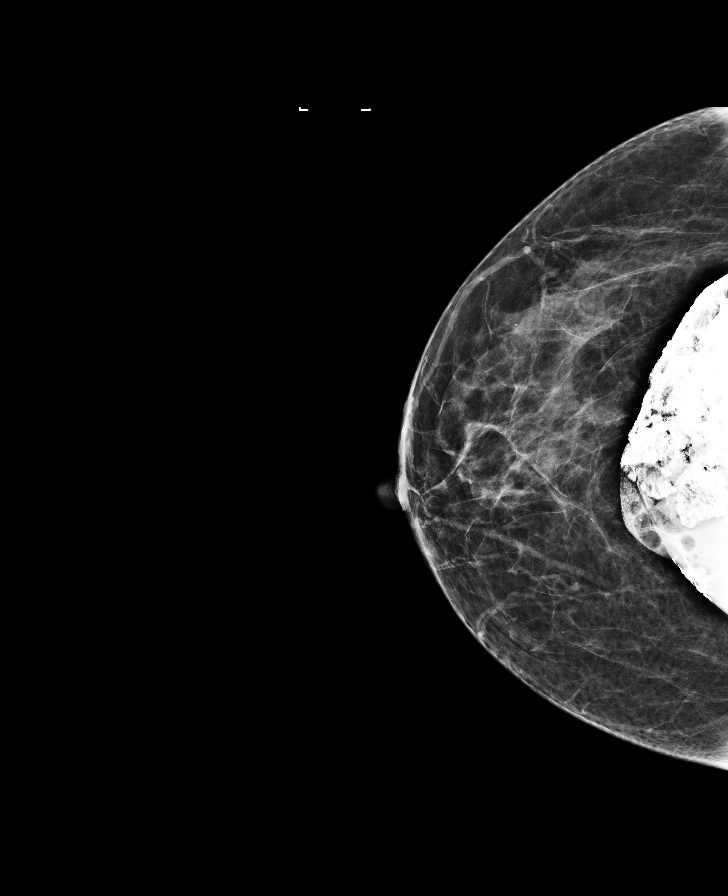

[L CC synth-2D]
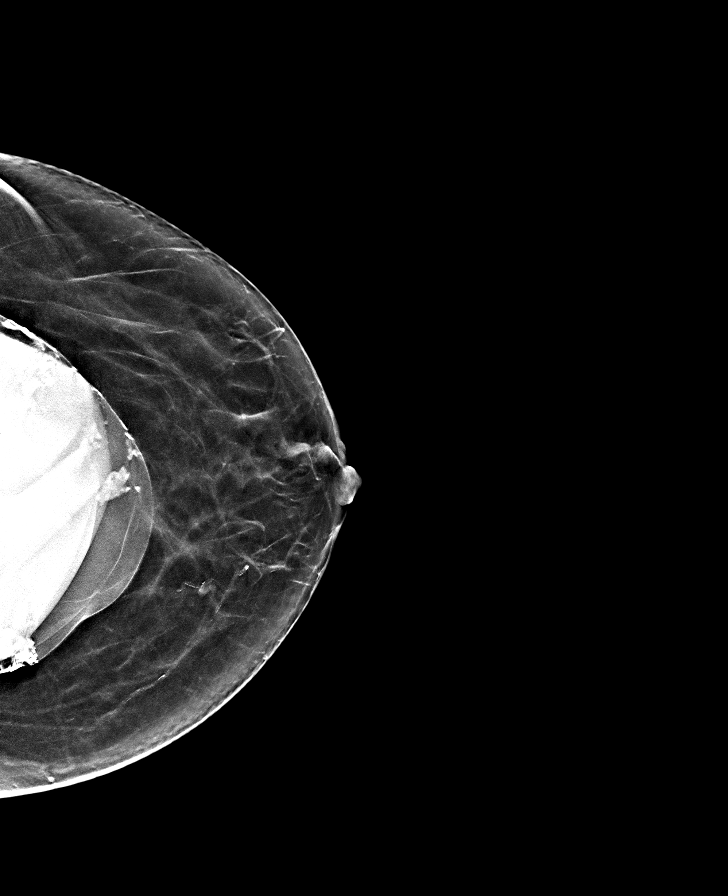

[R MLO synth-2D]
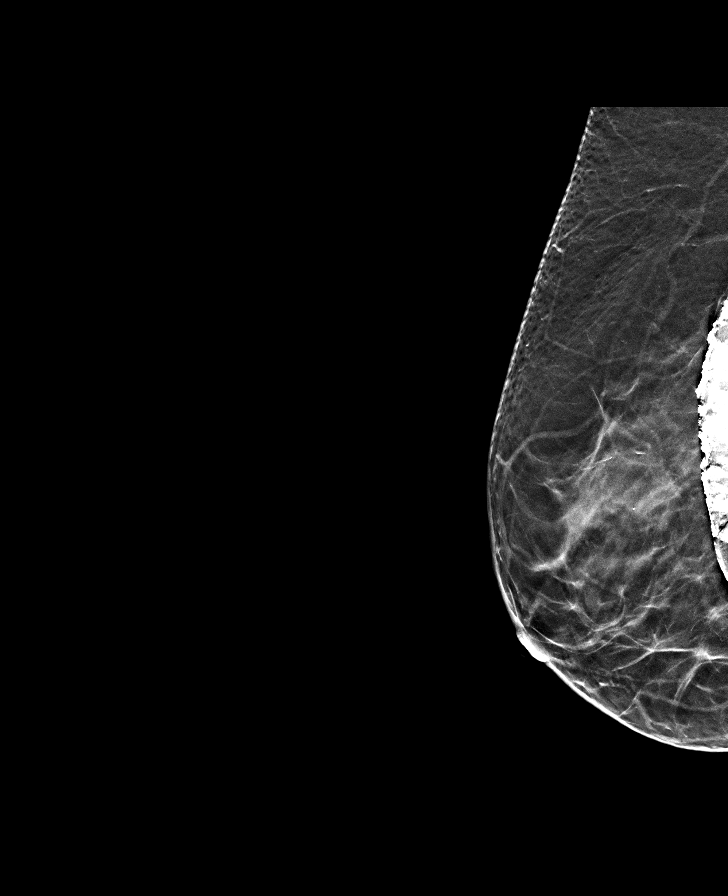

[L CC (2 of 2)]
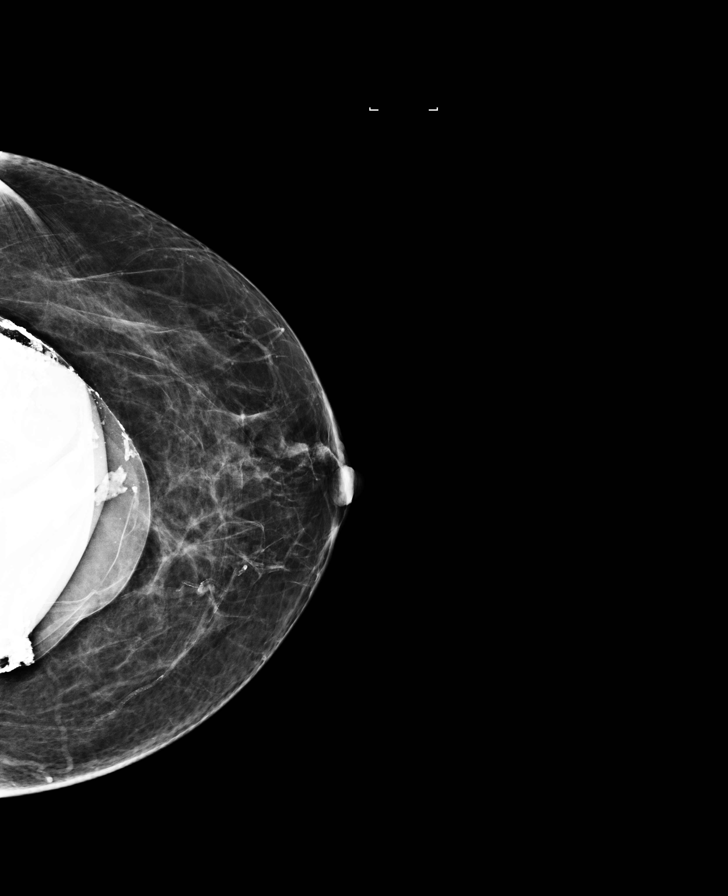

[8 of 32 positions shown; findings below may reference images not displayed]

ACR Breast Density Category b: There are scattered areas of
fibroglandular density.
FINDINGS: There are no findings suspicious for malignancy. Chronic appearing
contour bulges and calcifications involving the bilateral breast
implants. Images were processed with CAD.
IMPRESSION: No mammographic evidence of malignancy. A result letter of this
screening mammogram will be mailed directly to the patient.

RECOMMENDATION:
Screening mammogram in one year. (Code:76-3-QWD)

BI-RADS CATEGORY  1:  Negative.

## 2019-11-03 ENCOUNTER — Other Ambulatory Visit: Payer: Self-pay | Admitting: Family Medicine

## 2019-11-03 DIAGNOSIS — Z1231 Encounter for screening mammogram for malignant neoplasm of breast: Secondary | ICD-10-CM

## 2019-12-08 ENCOUNTER — Other Ambulatory Visit: Payer: Self-pay

## 2019-12-08 ENCOUNTER — Ambulatory Visit
Admission: RE | Admit: 2019-12-08 | Discharge: 2019-12-08 | Disposition: A | Discharge status: Home / Self Care | Payer: Medicare HMO | Source: Ambulatory Visit | Attending: Family Medicine | Admitting: Family Medicine

## 2019-12-08 DIAGNOSIS — Z1231 Encounter for screening mammogram for malignant neoplasm of breast: Secondary | ICD-10-CM | POA: Diagnosis not present

## 2020-11-14 ENCOUNTER — Other Ambulatory Visit: Payer: Self-pay | Admitting: Family Medicine

## 2020-11-14 DIAGNOSIS — Z1231 Encounter for screening mammogram for malignant neoplasm of breast: Secondary | ICD-10-CM

## 2020-12-17 ENCOUNTER — Ambulatory Visit
Admission: RE | Admit: 2020-12-17 | Discharge: 2020-12-17 | Disposition: A | Discharge status: Home / Self Care | Payer: Medicare HMO | Source: Ambulatory Visit | Attending: Family Medicine | Admitting: Family Medicine

## 2020-12-17 ENCOUNTER — Other Ambulatory Visit: Payer: Self-pay

## 2020-12-17 DIAGNOSIS — Z1231 Encounter for screening mammogram for malignant neoplasm of breast: Secondary | ICD-10-CM | POA: Insufficient documentation

## 2021-11-10 ENCOUNTER — Other Ambulatory Visit: Payer: Self-pay | Admitting: Family Medicine

## 2021-11-10 DIAGNOSIS — Z1231 Encounter for screening mammogram for malignant neoplasm of breast: Secondary | ICD-10-CM

## 2021-12-18 ENCOUNTER — Ambulatory Visit
Admission: RE | Admit: 2021-12-18 | Discharge: 2021-12-18 | Disposition: A | Discharge status: Home / Self Care | Payer: Medicare (Managed Care) | Source: Ambulatory Visit | Attending: Family Medicine | Admitting: Family Medicine

## 2021-12-18 DIAGNOSIS — Z1231 Encounter for screening mammogram for malignant neoplasm of breast: Secondary | ICD-10-CM | POA: Diagnosis not present

## 2022-10-12 ENCOUNTER — Other Ambulatory Visit: Payer: Self-pay | Admitting: Family Medicine

## 2022-10-12 DIAGNOSIS — Z1231 Encounter for screening mammogram for malignant neoplasm of breast: Secondary | ICD-10-CM

## 2022-12-21 ENCOUNTER — Ambulatory Visit
Admission: RE | Admit: 2022-12-21 | Discharge: 2022-12-21 | Disposition: A | Discharge status: Home / Self Care | Payer: Medicare (Managed Care) | Source: Ambulatory Visit | Attending: Family Medicine | Admitting: Family Medicine

## 2022-12-21 DIAGNOSIS — Z1231 Encounter for screening mammogram for malignant neoplasm of breast: Secondary | ICD-10-CM | POA: Insufficient documentation

## 2023-11-16 ENCOUNTER — Other Ambulatory Visit: Payer: Self-pay | Admitting: Family Medicine

## 2023-11-16 DIAGNOSIS — Z1231 Encounter for screening mammogram for malignant neoplasm of breast: Secondary | ICD-10-CM

## 2023-12-22 ENCOUNTER — Ambulatory Visit
Admission: RE | Admit: 2023-12-22 | Discharge: 2023-12-22 | Disposition: A | Payer: Medicare (Managed Care) | Source: Ambulatory Visit | Attending: Family Medicine | Admitting: Family Medicine

## 2023-12-22 DIAGNOSIS — Z1231 Encounter for screening mammogram for malignant neoplasm of breast: Secondary | ICD-10-CM | POA: Insufficient documentation

## 2024-02-23 ENCOUNTER — Other Ambulatory Visit: Payer: Self-pay

## 2024-02-23 ENCOUNTER — Inpatient Hospital Stay
Admission: EM | Admit: 2024-02-23 | Source: Home / Self Care | Attending: Internal Medicine | Admitting: Internal Medicine

## 2024-02-23 ENCOUNTER — Emergency Department

## 2024-02-23 ENCOUNTER — Inpatient Hospital Stay

## 2024-02-23 DIAGNOSIS — K219 Gastro-esophageal reflux disease without esophagitis: Secondary | ICD-10-CM | POA: Diagnosis present

## 2024-02-23 DIAGNOSIS — F419 Anxiety disorder, unspecified: Secondary | ICD-10-CM | POA: Diagnosis present

## 2024-02-23 DIAGNOSIS — R6520 Severe sepsis without septic shock: Secondary | ICD-10-CM

## 2024-02-23 DIAGNOSIS — A419 Sepsis, unspecified organism: Secondary | ICD-10-CM | POA: Diagnosis present

## 2024-02-23 DIAGNOSIS — F32A Depression, unspecified: Secondary | ICD-10-CM | POA: Diagnosis present

## 2024-02-23 DIAGNOSIS — M6282 Rhabdomyolysis: Principal | ICD-10-CM | POA: Insufficient documentation

## 2024-02-23 DIAGNOSIS — L03116 Cellulitis of left lower limb: Secondary | ICD-10-CM

## 2024-02-23 DIAGNOSIS — N179 Acute kidney failure, unspecified: Secondary | ICD-10-CM | POA: Diagnosis present

## 2024-02-23 DIAGNOSIS — J45909 Unspecified asthma, uncomplicated: Secondary | ICD-10-CM | POA: Diagnosis present

## 2024-02-23 DIAGNOSIS — I10 Essential (primary) hypertension: Secondary | ICD-10-CM | POA: Diagnosis present

## 2024-02-23 DIAGNOSIS — M797 Fibromyalgia: Secondary | ICD-10-CM | POA: Diagnosis present

## 2024-02-23 LAB — TROPONIN T, HIGH SENSITIVITY
Troponin T High Sensitivity: 91 ng/L — ABNORMAL HIGH (ref 0–19)
Troponin T High Sensitivity: 75 ng/L — ABNORMAL HIGH (ref 0–19)

## 2024-02-23 LAB — LACTIC ACID, PLASMA
Lactic Acid, Venous: 2.5 mmol/L (ref 0.5–1.9)
Lactic Acid, Venous: 1.3 mmol/L (ref 0.5–1.9)

## 2024-02-23 LAB — CBC WITH DIFFERENTIAL/PLATELET
Abs Immature Granulocytes: 0.09 10*3/uL — ABNORMAL HIGH (ref 0.00–0.07)
Basophils Absolute: 0.1 10*3/uL (ref 0.0–0.1)
Basophils Relative: 1 %
Eosinophils Absolute: 0 10*3/uL (ref 0.0–0.5)
Eosinophils Relative: 0 %
HCT: 48.4 % — ABNORMAL HIGH (ref 36.0–46.0)
Hemoglobin: 16.3 g/dL — ABNORMAL HIGH (ref 12.0–15.0)
Immature Granulocytes: 1 %
Lymphocytes Relative: 13 %
Lymphs Abs: 2.3 10*3/uL (ref 0.7–4.0)
MCH: 31.3 pg (ref 26.0–34.0)
MCHC: 33.7 g/dL (ref 30.0–36.0)
MCV: 92.9 fL (ref 80.0–100.0)
Monocytes Absolute: 1.2 10*3/uL — ABNORMAL HIGH (ref 0.1–1.0)
Monocytes Relative: 7 %
Neutro Abs: 14.1 10*3/uL — ABNORMAL HIGH (ref 1.7–7.7)
Neutrophils Relative %: 78 %
Platelets: 413 10*3/uL — ABNORMAL HIGH (ref 150–400)
RBC: 5.21 MIL/uL — ABNORMAL HIGH (ref 3.87–5.11)
RDW: 14.2 % (ref 11.5–15.5)
WBC: 17.8 10*3/uL — ABNORMAL HIGH (ref 4.0–10.5)
nRBC: 0 % (ref 0.0–0.2)

## 2024-02-23 LAB — URINALYSIS, W/ REFLEX TO CULTURE (INFECTION SUSPECTED)
Bacteria, UA: NONE SEEN
Bilirubin Urine: NEGATIVE
Glucose, UA: NEGATIVE mg/dL
Hgb urine dipstick: NEGATIVE
Ketones, ur: 5 mg/dL — AB
Leukocytes,Ua: NEGATIVE
Nitrite: NEGATIVE
Protein, ur: 30 mg/dL — AB
RBC / HPF: 0 RBC/hpf (ref 0–5)
Specific Gravity, Urine: 1.019 (ref 1.005–1.030)
Squamous Epithelial / HPF: 0 /HPF (ref 0–5)
pH: 5 (ref 5.0–8.0)

## 2024-02-23 LAB — COMPREHENSIVE METABOLIC PANEL WITH GFR
ALT: 84 U/L — ABNORMAL HIGH (ref 0–44)
AST: 130 U/L — ABNORMAL HIGH (ref 15–41)
Albumin: 3.9 g/dL (ref 3.5–5.0)
Alkaline Phosphatase: 69 U/L (ref 38–126)
Anion gap: 23 — ABNORMAL HIGH (ref 5–15)
BUN: 41 mg/dL — ABNORMAL HIGH (ref 8–23)
CO2: 19 mmol/L — ABNORMAL LOW (ref 22–32)
Calcium: 9.4 mg/dL (ref 8.9–10.3)
Chloride: 100 mmol/L (ref 98–111)
Creatinine, Ser: 1.61 mg/dL — ABNORMAL HIGH (ref 0.44–1.00)
GFR, Estimated: 33 mL/min — ABNORMAL LOW
Glucose, Bld: 145 mg/dL — ABNORMAL HIGH (ref 70–99)
Potassium: 4.3 mmol/L (ref 3.5–5.1)
Sodium: 143 mmol/L (ref 135–145)
Total Bilirubin: 0.6 mg/dL (ref 0.0–1.2)
Total Protein: 6.3 g/dL — ABNORMAL LOW (ref 6.5–8.1)

## 2024-02-23 LAB — RESP PANEL BY RT-PCR (RSV, FLU A&B, COVID)  RVPGX2
Influenza A by PCR: NEGATIVE
Influenza B by PCR: NEGATIVE
Resp Syncytial Virus by PCR: NEGATIVE
SARS Coronavirus 2 by RT PCR: NEGATIVE

## 2024-02-23 LAB — CK: Total CK: 3352 U/L — ABNORMAL HIGH (ref 38–234)

## 2024-02-23 LAB — PROTIME-INR
INR: 1.1 (ref 0.8–1.2)
Prothrombin Time: 14.9 s (ref 11.4–15.2)

## 2024-02-23 MED ORDER — VANCOMYCIN VARIABLE DOSE PER UNSTABLE RENAL FUNCTION (PHARMACIST DOSING): Status: DC

## 2024-02-23 MED ORDER — HEPARIN SODIUM (PORCINE) 5000 UNIT/ML IJ SOLN
5000.0000 [IU] | Freq: Three times a day (TID) | INTRAMUSCULAR | Status: DC
  Administered 2024-02-23 – 2024-02-24 (×3): 5000 [IU] via SUBCUTANEOUS
  Filled 2024-02-23 (×3): qty 1

## 2024-02-23 MED ORDER — LORAZEPAM 2 MG/ML IJ SOLN
0.5000 mg | Freq: Four times a day (QID) | INTRAMUSCULAR | Status: AC | PRN
  Administered 2024-02-24: 0.5 mg via INTRAVENOUS
  Filled 2024-02-23: qty 1

## 2024-02-23 MED ORDER — SODIUM CHLORIDE 0.9 % IV SOLN: 2.0000 g | INTRAVENOUS | Status: DC

## 2024-02-23 MED ORDER — PIPERACILLIN-TAZOBACTAM 3.375 G IVPB 30 MIN
3.3750 g | Freq: Once | INTRAVENOUS | Status: AC
  Administered 2024-02-23: 3.375 g via INTRAVENOUS
  Filled 2024-02-23: qty 50

## 2024-02-23 MED ORDER — ACETAMINOPHEN 325 MG PO TABS
650.0000 mg | ORAL_TABLET | Freq: Four times a day (QID) | ORAL | Status: AC | PRN
  Administered 2024-02-24 – 2024-02-25 (×4): 650 mg via ORAL
  Filled 2024-02-23 (×4): qty 2

## 2024-02-23 MED ORDER — IPRATROPIUM-ALBUTEROL 0.5-2.5 (3) MG/3ML IN SOLN: 3.0000 mL | Freq: Four times a day (QID) | RESPIRATORY_TRACT | Status: AC | PRN

## 2024-02-23 MED ORDER — NYSTATIN 100000 UNIT/GM EX POWD
Freq: Three times a day (TID) | CUTANEOUS | Status: AC
  Filled 2024-02-23 (×2): qty 15

## 2024-02-23 MED ORDER — SENNOSIDES-DOCUSATE SODIUM 8.6-50 MG PO TABS: 1.0000 | ORAL_TABLET | Freq: Every evening | ORAL | Status: AC | PRN

## 2024-02-23 MED ORDER — METRONIDAZOLE 500 MG/100ML IV SOLN
500.0000 mg | Freq: Two times a day (BID) | INTRAVENOUS | Status: DC
  Administered 2024-02-23 – 2024-02-24 (×2): 500 mg via INTRAVENOUS
  Filled 2024-02-23 (×3): qty 100

## 2024-02-23 MED ORDER — SODIUM CHLORIDE 0.9 % IV BOLUS
2000.0000 mL | Freq: Once | INTRAVENOUS | Status: AC
  Administered 2024-02-23: 2000 mL via INTRAVENOUS

## 2024-02-23 MED ORDER — ACETAMINOPHEN 650 MG RE SUPP: 650.0000 mg | Freq: Four times a day (QID) | RECTAL | Status: AC | PRN

## 2024-02-23 MED ORDER — HYDRALAZINE HCL 20 MG/ML IJ SOLN: 5.0000 mg | Freq: Four times a day (QID) | INTRAMUSCULAR | Status: AC | PRN

## 2024-02-23 MED ORDER — SODIUM CHLORIDE 0.9 % IV BOLUS: 1000.0000 mL | Freq: Once | INTRAVENOUS | Status: DC

## 2024-02-23 MED ORDER — VANCOMYCIN HCL IN DEXTROSE 1-5 GM/200ML-% IV SOLN
1000.0000 mg | Freq: Once | INTRAVENOUS | Status: AC
  Administered 2024-02-23: 1000 mg via INTRAVENOUS
  Filled 2024-02-23: qty 200

## 2024-02-23 MED ORDER — SODIUM CHLORIDE 0.9 % IV SOLN: INTRAVENOUS | Status: DC

## 2024-02-23 MED ORDER — ONDANSETRON HCL 4 MG PO TABS: 4.0000 mg | ORAL_TABLET | Freq: Four times a day (QID) | ORAL | Status: AC | PRN

## 2024-02-23 MED ORDER — SODIUM CHLORIDE 0.9 % IV BOLUS
500.0000 mL | Freq: Once | INTRAVENOUS | Status: AC
  Administered 2024-02-23: 500 mL via INTRAVENOUS

## 2024-02-23 MED ORDER — ONDANSETRON HCL 4 MG/2ML IJ SOLN: 4.0000 mg | Freq: Four times a day (QID) | INTRAMUSCULAR | Status: AC | PRN
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
## 2024-02-23 NOTE — Assessment & Plan Note (Signed)
 PDMP reviewed Lorazepam  0.5 mg IV every 6 hours as needed for anxiety, 1 day
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
## 2024-02-23 NOTE — ED Notes (Signed)
 Transported to MRI

## 2024-02-23 NOTE — ED Notes (Signed)
 Pt refuses CAM boot at this time
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
## 2024-02-23 NOTE — Assessment & Plan Note (Signed)
 I suspect this is acute kidney injury however prior labs were from 4 years ago which did not show any chronic kidney disease Patient is status post aggressive 2.5 L of sodium chloride  infusion per EDP Continue with sodium chloride  infusion at 150 mL/h, 1 day ordered Recheck BMP in a.m.

## 2024-02-23 NOTE — Assessment & Plan Note (Addendum)
 And multiple other skin areas in the body, see images below Unclear how long these have been there Continue with cefepime and vancomycin  per pharmacy MRI of bilateral foot to assess for osteomyelitis ordered on admission

## 2024-02-23 NOTE — Consult Note (Signed)
 Pharmacy Antibiotic Note  Leslie Gould is a 74 y.o. female admitted on 02/23/2024 with sepsis of unknown source.  Patient was found down for an unknown amount of time. CK elevated at 3352, lactic acid 2.5.Pharmacy has been consulted for vancomycin  and cefepime dosing.  Patient likely has an AKI, Scr. on admission 1.61, prior labs from 4 years ago (prior BL~ 0.9-1.0)  Plan:  Vancomycin  2,000 mg LD given at 1547 on 2/4. Will follow Vancomycin  variable dosing protocol and order an 24-hour random level to assess vancomycin  clearance. Will monitor renal function daily while on vanc and determine when a scheduled maintenance regimen is appropriate. Follow cultures for adjustments.   Height: 5' (152.4 cm) Weight: 82.3 kg (181 lb 6.4 oz) IBW/kg (Calculated) : 45.5  Temp (24hrs), Avg:98.1 F (36.7 C), Min:98.1 F (36.7 C), Max:98.1 F (36.7 C)  Recent Labs  Lab 02/23/24 1323 02/23/24 1400  WBC 17.8*  --   CREATININE  --  1.61*  LATICACIDVEN 2.5*  --     Estimated Creatinine Clearance: 28.7 mL/min (A) (by C-G formula based on SCr of 1.61 mg/dL (H)).    Allergies[1]  Antimicrobials this admission: 2/4 vancomycin  >>  2/4 cefepime >>   Microbiology results: 2/4 BCx: pending  Thank you for allowing pharmacy to be a part of this patients care.  Annabella LOISE Banks, PharmD Clinical Pharmacist 02/23/2024 5:55 PM      [1]  Allergies Allergen Reactions   Lithium Other (See Comments)    Tremors, nausea Tremors, nausea

## 2024-02-23 NOTE — Assessment & Plan Note (Signed)
 PDMP reviewed Patient is currently on diazepam 

## 2024-02-23 NOTE — Assessment & Plan Note (Addendum)
 On admission, patient meets sepsis criteria with elevated heart rate, lactic acid of 2.5, white cell count elevated at 17.8. Source/etiology is unclear at this time.  Organ involvement includes cardiac with elevated HS troponin at 91 and possible acute kidney injury. Blood cultures x 2 have been ordered and pending collection Continue with vancomycin , cefepime, metronidazole  per pharmacy Source is suspected to be cellulitis recheck a second lactic acid and hs troponin to ensure downtrending Maintain MAP > 65 Admit to PCU, inpt
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
## 2024-02-23 NOTE — Consult Note (Signed)
 CODE SEPSIS - PHARMACY COMMUNICATION  **Broad Spectrum Antibiotics should be administered within 1 hour of Sepsis diagnosis**  Time Code Sepsis Called/Page Received: 1607  Antibiotics Ordered: zosyn  and vancomycin  x1  Time of 1st antibiotic administration: 1545  Additional action taken by pharmacy: none  If necessary, Name of Provider/Nurse Contacted: n/a    Annabella LOISE Banks ,PharmD Clinical Pharmacist  02/23/2024  4:17 PM

## 2024-02-23 NOTE — Assessment & Plan Note (Signed)
 DuoNebs every 6 hours.  For wheezing and shortness of breath

## 2024-02-23 NOTE — ED Notes (Signed)
 CCMD called to initiate cardiac monitoring.

## 2024-02-23 NOTE — Assessment & Plan Note (Addendum)
 Pending med rec conciliation Maintain MAP > 65 in setting of severe sepsis Hydralazine  5 mg IV every 6 hours as needed for SBP greater 170, 5 days ordered

## 2024-02-23 NOTE — Consult Note (Signed)
 ED Pharmacy Antibiotic Sign Off An antibiotic consult was received from an ED provider for vancomycin  per pharmacy dosing for sepsis. A chart review was completed to assess appropriateness.   The following one time order(s) were placed:  Vancomycin  2,000 mg x1   Further antibiotic and/or antibiotic pharmacy consults should be ordered by the admitting provider if indicated.   Thank you for allowing pharmacy to be a part of this patient's care.   Annabella LOISE Banks, [facility]  Clinical Pharmacist 02/23/24 3:24 PM

## 2024-02-24 LAB — CBC
HCT: 42.1 % (ref 36.0–46.0)
Hemoglobin: 14 g/dL (ref 12.0–15.0)
MCH: 31.1 pg (ref 26.0–34.0)
MCHC: 33.3 g/dL (ref 30.0–36.0)
MCV: 93.6 fL (ref 80.0–100.0)
Platelets: 354 10*3/uL (ref 150–400)
RBC: 4.5 MIL/uL (ref 3.87–5.11)
RDW: 14.2 % (ref 11.5–15.5)
WBC: 13.2 10*3/uL — ABNORMAL HIGH (ref 4.0–10.5)
nRBC: 0 % (ref 0.0–0.2)

## 2024-02-24 LAB — BASIC METABOLIC PANEL WITH GFR
Anion gap: 18 — ABNORMAL HIGH (ref 5–15)
BUN: 39 mg/dL — ABNORMAL HIGH (ref 8–23)
CO2: 19 mmol/L — ABNORMAL LOW (ref 22–32)
Calcium: 8.4 mg/dL — ABNORMAL LOW (ref 8.9–10.3)
Chloride: 105 mmol/L (ref 98–111)
Creatinine, Ser: 0.96 mg/dL (ref 0.44–1.00)
GFR, Estimated: >60 mL/min
Glucose, Bld: 119 mg/dL — ABNORMAL HIGH (ref 70–99)
Potassium: 3.7 mmol/L (ref 3.5–5.1)
Sodium: 142 mmol/L (ref 135–145)

## 2024-02-24 LAB — CK: Total CK: 1381 U/L — ABNORMAL HIGH (ref 38–234)

## 2024-02-24 MED ORDER — PANTOPRAZOLE SODIUM 40 MG PO TBEC
40.0000 mg | DELAYED_RELEASE_TABLET | Freq: Every day | ORAL | Status: AC
  Administered 2024-02-24 – 2024-02-25 (×2): 40 mg via ORAL
  Filled 2024-02-24 (×2): qty 1

## 2024-02-24 MED ORDER — ENOXAPARIN SODIUM 40 MG/0.4ML IJ SOSY
40.0000 mg | PREFILLED_SYRINGE | INTRAMUSCULAR | Status: AC
  Administered 2024-02-24 – 2024-02-25 (×2): 40 mg via SUBCUTANEOUS
  Filled 2024-02-24 (×2): qty 0.4

## 2024-02-24 MED ORDER — METOPROLOL TARTRATE 5 MG/5ML IV SOLN
10.0000 mg | Freq: Once | INTRAVENOUS | Status: AC
  Administered 2024-02-24: 10 mg via INTRAVENOUS
  Filled 2024-02-24: qty 10

## 2024-02-24 MED ORDER — SODIUM CHLORIDE 0.9 % IV SOLN: INTRAVENOUS | Status: DC

## 2024-02-24 MED ORDER — DIAZEPAM 5 MG PO TABS
5.0000 mg | ORAL_TABLET | Freq: Two times a day (BID) | ORAL | Status: AC | PRN
  Administered 2024-02-24: 5 mg via ORAL
  Filled 2024-02-24: qty 1

## 2024-02-24 MED ORDER — PROPRANOLOL HCL 40 MG PO TABS
80.0000 mg | ORAL_TABLET | Freq: Two times a day (BID) | ORAL | Status: AC
  Administered 2024-02-24 – 2024-02-25 (×4): 80 mg via ORAL
  Filled 2024-02-24: qty 4
  Filled 2024-02-24 (×3): qty 2

## 2024-02-24 MED ORDER — OLANZAPINE 7.5 MG PO TABS
22.5000 mg | ORAL_TABLET | Freq: Every day | ORAL | Status: AC
  Administered 2024-02-24 – 2024-02-25 (×2): 22.5 mg via ORAL
  Filled 2024-02-24 (×3): qty 3

## 2024-02-24 NOTE — ED Notes (Signed)
Post op show applied

## 2024-02-24 NOTE — ED Notes (Signed)
 Secure chat sent to Dr. Awanda informed her pt would like something to help with constipation and that she has not had any urine output for the past 8 hours

## 2024-02-24 NOTE — ED Notes (Signed)
 Oral care performed by this RN

## 2024-02-24 NOTE — ED Notes (Signed)
 Pts brief checked by this RN and Arletta, RN. Pt is dry.

## 2024-02-24 NOTE — ED Notes (Signed)
 Family updated as to patient's status.

## 2024-02-24 NOTE — Plan of Care (Signed)

## 2024-02-24 NOTE — ED Notes (Signed)
 Pt sleeping at this time.

## 2024-02-24 NOTE — ED Notes (Signed)
 Messaged SLP to get ETA for swallowing eval.

## 2024-02-24 NOTE — Evaluation (Signed)
 Clinical/Bedside Swallow Evaluation Patient Details  Name: Leslie Gould MRN: [id number] Date of Birth: 17-Oct-1950  Today's Date: 02/24/2024 Time: SLP Start Time (ACUTE ONLY): 1100 SLP Stop Time (ACUTE ONLY): 1130 SLP Time Calculation (min) (ACUTE ONLY): 30 min  Past Medical History:  Past Medical History:  Diagnosis Date   Allergy    Anxiety    Asthma    Chronic fatigue    Clotting disorder    Depression    Diabetes mellitus without complication (HCC)    Fibromyalgia    GERD (gastroesophageal reflux disease)    Hypertension    Past Surgical History:  Past Surgical History:  Procedure Laterality Date   APPENDECTOMY     AUGMENTATION MAMMAPLASTY Bilateral 1980   CESAREAN SECTION     TONSILLECTOMY     TUBAL LIGATION     HPI:  Ms. Leslie Gould is a 74 year old female with history of anxiety, depression, hypertension, hyperlipidemia. Admitted for severe sepsis with acute organ dysfunction and rhabdomyolysis. CT Head: No acute intracranial abnormality. DG Chest: Platelike opacity in the LEFT mid lung most consistent with atelectasis.    Assessment / Plan / Recommendation  Clinical Impression  Pt seen for bedside swallow assessment in the setting of concern for aspiration/pt failing nursing swallow screen. No hx of dysphagia, pt reports chronic cough for years and frequent belching (takes PPI at baseline). Oral motor function intact. Pt seen with trials of thin liquids (via straw), puree, and regular solids. No overt or subtle s/sx pharyngeal dysphagia noted. No change to vocal quality across trials. Vitals stable for duration of trials (O2 remaining greater than 90 for duration of session). Oral phase grossly intact- with complete manipulation and clearance of regular solid from oral cavity.   Based on current debility and hx of GERD, pt is at increased risk of aspiration. Risk managed with standard aspiration precautions (slow rate, small bites, elevated HOB, and alert for PO  intake). Recommend regular solids and thin liquids. MD and RN aware of recommendations.   SLP Visit Diagnosis: Dysphagia, unspecified (R13.10) (related to acute deconditioning)    Aspiration Risk  Mild aspiration risk    Diet Recommendation   Thin;Age appropriate regular  Medication Administration: Whole meds with liquid (vs in puree)    Other Recommendations Recommended Consults: Consider GI evaluation (given persisitent belching- OP if desired by pt) Oral Care Recommendations: Oral care BID (assist as needed)      Functional Status Assessment Patient has not had a recent decline in their functional status    Swallow Study   General Date of Onset: 02/24/24 HPI: Ms. Leslie Gould is a 74 year old female with history of anxiety, depression, hypertension, hyperlipidemia. Admitted for severe sepsis with acute organ dysfunction and rhabdomyolysis. CT Head: No acute intracranial abnormality. DG Chest: Platelike opacity in the LEFT mid lung most consistent with atelectasis. Type of Study: Bedside Swallow Evaluation Previous Swallow Assessment: none in chart Diet Prior to this Study: NPO Temperature Spikes Noted: No (WBC 13.2) Respiratory Status: Room air History of Recent Intubation: No Behavior/Cognition: Alert;Cooperative Oral Cavity Assessment: Dry Oral Care Completed by SLP: Yes Oral Cavity - Dentition: Adequate natural dentition Vision: Functional for self-feeding Self-Feeding Abilities: Able to feed self;Needs assist;Needs set up Patient Positioning: Upright in bed Baseline Vocal Quality: Normal Volitional Cough: Strong Volitional Swallow: Able to elicit    Oral/Motor/Sensory Function Overall Oral Motor/Sensory Function: Within functional limits   Ice Chips Ice chips: Not tested   Thin Liquid Thin Liquid: Within functional limits Presentation: Cup;Self Fed;Straw  Nectar Thick Nectar Thick Liquid: Not tested   Honey Thick Honey Thick Liquid: Not tested   Puree Puree:  Within functional limits Presentation: Self Fed;Spoon   Solid     Solid: Within functional limits Presentation: Self Fed     Leslie Wakeman Clapp, MS, CCC-SLP Speech Language Pathologist Rehab Services; [facility] [phone] (ascom)   Leslie Gould 02/24/2024,12:56 PM
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
## 2024-02-24 NOTE — ED Notes (Signed)
 EKG obtained. Dr. Awanda informed that EKG had been done. Pt denies hx/o abnormal heart rhythm.

## 2024-02-24 NOTE — ED Notes (Signed)
 Pt noted to be in A. Fib on the cardiac monitor with a rate in the low 100's to 120's. RN unable to find documented hx/o A. Fib in chart. Initial EKG showed Sinus tach. Secure chat sent to Dr. Awanda and verbal order for EKG was obtained. Dr. Awanda also informed that pt did not pass her swallow screen and that SLP evaluation was ordered for pt.

## 2024-02-24 NOTE — ED Notes (Signed)
 Moisturizer applied to pts lips

## 2024-02-24 NOTE — ED Notes (Signed)
 Per Dr. Awanda, do not need to collect blood cultures at this time.

## 2024-02-25 LAB — BASIC METABOLIC PANEL WITH GFR
Anion gap: 14 (ref 5–15)
BUN: 19 mg/dL (ref 8–23)
CO2: 21 mmol/L — ABNORMAL LOW (ref 22–32)
Calcium: 8.5 mg/dL — ABNORMAL LOW (ref 8.9–10.3)
Chloride: 106 mmol/L (ref 98–111)
Creatinine, Ser: 0.6 mg/dL (ref 0.44–1.00)
GFR, Estimated: >60 mL/min
Glucose, Bld: 106 mg/dL — ABNORMAL HIGH (ref 70–99)
Potassium: 3.7 mmol/L (ref 3.5–5.1)
Sodium: 141 mmol/L (ref 135–145)

## 2024-02-25 LAB — CK: Total CK: 737 U/L — ABNORMAL HIGH (ref 38–234)

## 2024-02-25 LAB — MAGNESIUM: Magnesium: 1.9 mg/dL (ref 1.7–2.4)

## 2024-02-25 MED ORDER — LOSARTAN POTASSIUM 50 MG PO TABS
50.0000 mg | ORAL_TABLET | Freq: Every day | ORAL | Status: AC
  Administered 2024-02-25: 50 mg via ORAL
  Filled 2024-02-25: qty 1

## 2024-02-25 NOTE — Care Management Important Message (Signed)
 Important Message  Patient Details  Name: Leslie Gould MRN: [id number] Date of Birth: 11/21/1950   Important Message Given:  Yes - Medicare IM     Leslie Gould 02/25/2024, 12:53 PM

## 2024-02-25 NOTE — NC FL2 (Signed)
 " [place]  MEDICAID FL2 LEVEL OF CARE FORM     IDENTIFICATION  Patient Name: Leslie Gould Birthdate: [birth year] Sex: female Admission Date (Current Location): 02/23/2024  [facility] [place] Number:  Chiropodist and Address:         Provider Number: [id number]  Attending Physician Name and Address:  Awanda City, MD  Relative Name and Phone Number:       Current Level of Care: Hospital Recommended Level of Care: Skilled Nursing Facility Prior Approval Number:    Date Approved/Denied:   PASRR Number: [id number]  Discharge Plan: SNF    Current Diagnoses: Patient Active Problem List   Diagnosis Date Noted   Severe sepsis with acute organ dysfunction (HCC) 02/23/2024   Rhabdomyolysis 02/23/2024   Cellulitis of left leg 02/23/2024   Hyponatremia 07/28/2019   AKI (acute kidney injury) 07/18/2019   Acute lower UTI 07/18/2019   Frequent falls 07/18/2019   Obesity, Class III, BMI 40-49.9 (morbid obesity) (HCC) 07/18/2019   History of recurrent deep vein thrombosis (DVT) 07/08/2017   Allergic rhinitis 07/08/2017   Hypertension    GERD (gastroesophageal reflux disease)    Fibromyalgia    Diabetes mellitus without complication (HCC)    Depression    Chronic fatigue    Asthma    Anxiety     Orientation RESPIRATION BLADDER Height & Weight     Self, Time, Situation, Place  Normal Continent Weight: 82.3 kg Height:  5' (152.4 cm)  BEHAVIORAL SYMPTOMS/MOOD NEUROLOGICAL BOWEL NUTRITION STATUS      Continent Diet (regular)  AMBULATORY STATUS COMMUNICATION OF NEEDS Skin   Limited Assist Verbally Skin abrasions, Bruising, Other (Comment) (Blisters)                       Personal Care Assistance Level of Assistance              Functional Limitations Info             SPECIAL CARE FACTORS FREQUENCY  PT (By licensed PT), OT (By licensed OT)                    Contractures Contractures Info: Not present    Additional Factors Info  Code  Status, Allergies Code Status Info: Full Allergies Info: Lithium           Current Medications (02/25/2024):  This is the current hospital active medication list Current Facility-Administered Medications  Medication Dose Route Frequency Provider Last Rate Last Admin   acetaminophen  (TYLENOL ) tablet 650 mg  650 mg Oral Q6H PRN Cox, Amy N, DO   650 mg at 02/25/24 1013   Or   acetaminophen  (TYLENOL ) suppository 650 mg  650 mg Rectal Q6H PRN Cox, Amy N, DO       diazepam  (VALIUM ) tablet 5 mg  5 mg Oral BID PRN Awanda City, MD   5 mg at 02/24/24 2250   enoxaparin  (LOVENOX ) injection 40 mg  40 mg Subcutaneous Q24H Awanda City, MD   40 mg at 02/24/24 2229   hydrALAZINE  (APRESOLINE ) injection 5 mg  5 mg Intravenous Q6H PRN Cox, Amy N, DO       ipratropium-albuterol  (DUONEB) 0.5-2.5 (3) MG/3ML nebulizer solution 3 mL  3 mL Nebulization Q6H PRN Cox, Amy N, DO       losartan  (COZAAR ) tablet 50 mg  50 mg Oral Daily Awanda City, MD   50 mg at 02/25/24 1013   nystatin  (MYCOSTATIN /NYSTOP ) topical powder  Topical TID Cox, Amy N, DO   Given at 02/25/24 1014   OLANZapine  (ZYPREXA ) tablet 22.5 mg  22.5 mg Oral QHS Awanda City, MD   22.5 mg at 02/24/24 2232   ondansetron  (ZOFRAN ) tablet 4 mg  4 mg Oral Q6H PRN Cox, Amy N, DO       Or   ondansetron  (ZOFRAN ) injection 4 mg  4 mg Intravenous Q6H PRN Cox, Amy N, DO       pantoprazole  (PROTONIX ) EC tablet 40 mg  40 mg Oral Daily Awanda City, MD   40 mg at 02/25/24 1013   propranolol  (INDERAL ) tablet 80 mg  80 mg Oral BID Awanda City, MD   80 mg at 02/25/24 1013   senna-docusate (Senokot-S) tablet 1 tablet  1 tablet Oral QHS PRN Cox, Amy N, DO         Discharge Medications: Please see discharge summary for a list of discharge medications.  Relevant Imaging Results:  Relevant Lab Results:   Additional Information SS# [id number]  [name], RN     "

## 2024-02-25 NOTE — Plan of Care (Signed)
  Problem: Pain Managment: Goal: General experience of comfort will improve and/or be controlled Outcome: Progressing   Problem: Safety: Goal: Ability to remain free from injury will improve Outcome: Progressing   Problem: Skin Integrity: Goal: Risk for impaired skin integrity will decrease Outcome: Progressing

## 2024-02-25 NOTE — TOC Initial Note (Signed)
 Transition of Care [facility]) - Initial/Assessment Note    Patient Details  Name: Leslie Gould MRN: [id number] Date of Birth: [birth year]  [facility]) CM/SW Contact:    Leslie ONEIDA Haddock, RN Phone Number: 02/25/2024, 3:53 PM  Clinical Narrative:                      Admitted qnm:[id number]  Admitted from: home PCP:No PCP on file. List of local PCP added to AVS.   Current home health/prior home health/DME: RW  Therapy recommending SNF Met with patient at bedside. She is in agreement and prefers [facility] accepted [facility] and notified tammy at [facility] with [facility] has [facility] auth for [facility]   Patient Goals and CMS Choice            Expected Discharge Plan and Services                                              Prior Living Arrangements/Services                       Activities of Daily Living   ADL Screening (condition at time of admission) Independently performs ADLs?: Yes (appropriate for developmental age) Is the patient deaf or have difficulty hearing?: No Does the patient have difficulty seeing, even when wearing glasses/contacts?: Yes Does the patient have difficulty concentrating, remembering, or making decisions?: Yes  Permission Sought/Granted                  Emotional Assessment              Admission diagnosis:  Severe sepsis with acute organ dysfunction (HCC) [A41.9, R65.20] Non-traumatic rhabdomyolysis [M62.82] Sepsis with acute renal failure without septic shock, due to unspecified organism, unspecified acute renal failure type (HCC) [A41.9, R65.20, N17.9] Patient Active Problem List   Diagnosis Date Noted   Severe sepsis with acute organ dysfunction (HCC) 02/23/2024   Rhabdomyolysis 02/23/2024   Cellulitis of left leg 02/23/2024   Hyponatremia 07/28/2019   AKI (acute kidney injury) 07/18/2019   Acute lower UTI 07/18/2019   Frequent falls 07/18/2019   Obesity, Class III, BMI  40-49.9 (morbid obesity) (HCC) 07/18/2019   History of recurrent deep vein thrombosis (DVT) 07/08/2017   Allergic rhinitis 07/08/2017   Hypertension    GERD (gastroesophageal reflux disease)    Fibromyalgia    Diabetes mellitus without complication (HCC)    Depression    Chronic fatigue    Asthma    Anxiety    PCP:  Pcp, No Pharmacy:   [facility] [address] [place] - [address] [place] [address] Phone: [phone] Fax: [phone]  [id number] - [place], [place] - [address] [place] [address] Phone: [phone] Fax: [phone]     Social Drivers of Health (SDOH) Social History: SDOH Screenings   Food Insecurity: No Food Insecurity (02/24/2024)  Housing: Low Risk (02/24/2024)  Transportation Needs: No Transportation Needs (02/24/2024)  Utilities: Not At Risk (02/24/2024)  Social Connections: Unknown (02/24/2024)  Tobacco Use: Low Risk (02/23/2024)   SDOH Interventions:     Readmission Risk Interventions     No data to display

## 2024-02-25 NOTE — Evaluation (Signed)
 Physical Therapy Evaluation Patient Details Name: Leslie Gould MRN: [id number] DOB: 03-Jun-1950 Today's Date: 02/25/2024  History of Present Illness  Pt is a 74 year old female with history of anxiety, depression, hypertension, DM, and hyperlipidemia. She presents for chief concerns of found down for unknown period of time.  MD assessment includes: Rhabdomyolysis, AKI, A-fib with RVR, L 2nd toe fracture.   Clinical Impression  Pt was pleasant and motivated to participate during the session and put forth good effort throughout. Pt required significant physical assist with bed mobility tasks per below and min A along with cues for sequencing to come to standing.  Once in standing pt was able to amb around 10 feet before fatiguing and returning to sitting with SpO2 and HR WNL on room air.  Pt will benefit from continued PT services upon discharge to safely address deficits listed in patient problem list for decreased caregiver assistance and eventual return to PLOF.          If plan is discharge home, recommend the following: A lot of help with walking and/or transfers;A little help with bathing/dressing/bathroom;Assistance with cooking/housework;Direct supervision/assist for medications management;Assist for transportation   Can travel by private vehicle   No    Equipment Recommendations Other (comment) (TBD at next venue of care)  Recommendations for Other Services       Functional Status Assessment Patient has had a recent decline in their functional status and demonstrates the ability to make significant improvements in function in a reasonable and predictable amount of time.     Precautions / Restrictions Precautions Precautions: Fall Restrictions Weight Bearing Restrictions Per Provider Order: Yes LLE Weight Bearing Per Provider Order: Weight bearing as tolerated Other Position/Activity Restrictions: LLE WBAT with post-op shoe donned      Mobility  Bed Mobility Overal bed  mobility: Needs Assistance Bed Mobility: Rolling, Sidelying to Sit, Sit to Sidelying Rolling: Min assist Sidelying to sit: Max assist     Sit to sidelying: Max assist General bed mobility comments: Max A for BLE and trunk control    Transfers Overall transfer level: Needs assistance Equipment used: Rolling walker (2 wheels) Transfers: Sit to/from Stand Sit to Stand: Min assist           General transfer comment: Pt unable to come to standing without min A with cues for hand placement provided    Ambulation/Gait Ambulation/Gait assistance: Contact guard assist Gait Distance (Feet): 10 Feet Assistive device: Rolling walker (2 wheels) Gait Pattern/deviations: Step-through pattern, Decreased step length - right, Decreased step length - left Gait velocity: decreased     General Gait Details: Pt able to march in place, take several steps laterally at the EOB, and then amb short room distances with mod lean on the RW for support before fatiguing and returning to sitting  Stairs            Wheelchair Mobility     Tilt Bed    Modified Rankin (Stroke Patients Only)       Balance Overall balance assessment: Needs assistance, History of Falls Sitting-balance support: Single extremity supported, Feet supported Sitting balance-Leahy Scale: Good     Standing balance support: Bilateral upper extremity supported, During functional activity, Reliant on assistive device for balance Standing balance-Leahy Scale: Fair                               Pertinent Vitals/Pain Pain Assessment Pain Assessment: 0-10 Pain Score: 8  Pain  Location: General body pain, somewhat more on the L side Pain Descriptors / Indicators: Sore Pain Intervention(s): Monitored during session, Repositioned, Patient requesting pain meds-RN notified, RN gave pain meds during session    Home Living Family/patient expects to be discharged to:: Private residence Living Arrangements:  Alone Available Help at Discharge: Other (Comment) (No assistance available upon discharge) Type of Home: Apartment Home Access: Level entry       Home Layout: One level Home Equipment: Grab bars - tub/shower Additional Comments: Pt reports that she owns a walker but not sure of what type    Prior Function Prior Level of Function : Independent/Modified Independent             Mobility Comments: Ind amb without an AD community distances, uses Pharmacist, [facility] [employer], multiple falls in the last 6 months ADLs Comments: Ind with ADLs, mostly sponge bathes     Extremity/Trunk Assessment   Upper Extremity Assessment Upper Extremity Assessment: Generalized weakness    Lower Extremity Assessment Lower Extremity Assessment: Generalized weakness       Communication   Communication Communication: No apparent difficulties    Cognition Arousal: Alert Behavior During Therapy: WFL for tasks assessed/performed   PT - Cognitive impairments: No apparent impairments                         Following commands: Intact       Cueing Cueing Techniques: Verbal cues     General Comments      Exercises Other Exercises Other Exercises: Log roll training provided for decreased caregiver assist with bed mobility tasks   Assessment/Plan    PT Assessment Patient needs continued PT services  PT Problem List Decreased strength;Decreased activity tolerance;Decreased balance;Decreased mobility;Decreased knowledge of use of DME;Pain       PT Treatment Interventions DME instruction;Gait training;Functional mobility training;Therapeutic activities;Therapeutic exercise;Balance training;Patient/family education    PT Goals (Current goals can be found in the Care Plan section)  Acute Rehab PT Goals Patient Stated Goal: To get stronger PT Goal Formulation: With patient Time For Goal Achievement: 03/09/24 Potential to Achieve Goals: Good    Frequency Min 2X/week      Co-evaluation               AM-PAC PT 6 Clicks Mobility  Outcome Measure Help needed turning from your back to your side while in a flat bed without using bedrails?: A Lot Help needed moving from lying on your back to sitting on the side of a flat bed without using bedrails?: Total Help needed moving to and from a bed to a chair (including a wheelchair)?: Total Help needed standing up from a chair using your arms (e.g., wheelchair or bedside chair)?: A Little Help needed to walk in hospital room?: A Lot Help needed climbing 3-5 steps with a railing? : Total 6 Click Score: 10    End of Session Equipment Utilized During Treatment: Gait belt Activity Tolerance: Patient tolerated treatment well Patient left: in bed;with call bell/phone within reach;with bed alarm set;with family/visitor present Nurse Communication: Mobility status PT Visit Diagnosis: Unsteadiness on feet (R26.81);History of falling (Z91.81);Difficulty in walking, not elsewhere classified (R26.2);Muscle weakness (generalized) (M62.81);Pain Pain - part of body:  (general body pain)    Time: 1001-1038 PT Time Calculation (min) (ACUTE ONLY): 37 min   Charges:   PT Evaluation $PT Eval Moderate Complexity: 1 Mod PT Treatments $Therapeutic Activity: 8-22 mins PT General Charges $$ ACUTE PT VISIT: 1 Visit  CHARM Glendia Bertin PT, DPT 02/25/24, 11:43 AM

## 2024-02-25 NOTE — Progress Notes (Signed)
" °  PROGRESS NOTE    Leslie Gould  FMW:[id number] DOB: March 02, 1950 DOA: 02/23/2024 PCP: Pcp, No  [id number]  LOS: 2 days   Brief hospital course:   Assessment & Plan: Leslie Gould is a 74 year old female with history of anxiety, depression, hypertension, hyperlipidemia who presented after being found down for unknown period of time.    Rhabdomyolysis Secondary to being found down for approximately 3 to 5 days, unsure of patient's down time Status post 2.5 mL liter bolus per EDP f/b MIVF@150  --CK trended down the next day --hold further fluid   AKI (acute kidney injury) --Cr back to baseline the next day after IVF --oral hydration  Afib RVR --developed after presentation.  Does not appear to have prior hx. --IV metop 10 mg x1 --cont home propranolol   * Severe sepsis, ruled out Cellulitis, ruled out --multiple patches of erythema over left leg and both knees were from carpet burns from crawling on the ground. --d/c'ed abx  Left 2nd toe fracture --Minimally impacted fracture of the base of the second proximal phalanx  --discussed with oncall podiatry, weight bearing in a post-op shoe vs the boot whatever is more comfortable   Anxiety --cont home Valium  PRN  Hx of Asthma --stable.      DVT prophylaxis: Lovenox  SQ Code Status: Full code  Family Communication: daughter updated at bedside today Level of care: Med-Surg Dispo:   The patient is from: home Anticipated d/c is to: SNF rehab   Subjective and Interval History:  Pt reported feeling better.  Good oral intake.   Objective: Vitals:   02/25/24 0410 02/25/24 0803 02/25/24 1635 02/25/24 2057  BP: (!) 140/86 (!) 152/109 (!) 138/90 (!) 141/97  Pulse: 86 93 77 96  Resp: 16 20 17 16   Temp: 97.7 F (36.5 C) 97.9 F (36.6 C) (!) 97.4 F (36.3 C) 98.2 F (36.8 C)  TempSrc: Oral     SpO2: 94% 98% 94% 98%  Weight:      Height:        Intake/Output Summary (Last 24 hours) at 02/25/2024 2124 Last data filed  at 02/25/2024 1421 Gross per 24 hour  Intake 240 ml  Output 1600 ml  Net -1360 ml   Filed Weights   02/23/24 1314 02/23/24 1740  Weight: 82.3 kg 82.3 kg    Examination:   Constitutional: NAD, AAOx3 HEENT: conjunctivae and lids normal, EOMI CV: No cyanosis.   RESP: normal respiratory effort, on RA Extremities: skin abrasions over both elbows, and both knees.   SKIN: warm, dry Neuro: II - XII grossly intact.   Psych: Normal mood and affect.  Appropriate judgement and reason   Data Reviewed: I have personally reviewed labs and imaging studies  Time spent: 35 minutes  Ellouise Haber, MD [facility] If 7PM-7AM, please contact night-coverage 02/25/2024, 9:24 PM   "
